# Patient Record
Sex: Female | Born: 1990
Health system: Southern US, Community
[De-identification: ages and names within clinical notes are randomized; demographics above are authoritative.]

## PROBLEM LIST (undated history)

## (undated) DIAGNOSIS — R519 Headache, unspecified: Secondary | ICD-10-CM

## (undated) DIAGNOSIS — N83209 Unspecified ovarian cyst, unspecified side: Secondary | ICD-10-CM

## (undated) DIAGNOSIS — H5461 Unqualified visual loss, right eye, normal vision left eye: Secondary | ICD-10-CM

## (undated) HISTORY — PX: SPINE SURGERY: SHX786

## (undated) HISTORY — DX: Unspecified ovarian cyst, unspecified side: N83.209

## (undated) HISTORY — PX: EYE SURGERY: SHX253

## (undated) HISTORY — DX: Unqualified visual loss, right eye, normal vision left eye: H54.61

## (undated) HISTORY — DX: Headache, unspecified: R51.9

---

## 2010-01-11 ENCOUNTER — Encounter: Payer: Self-pay | Admitting: Maternal & Fetal Medicine

## 2010-01-12 ENCOUNTER — Encounter: Payer: Self-pay | Admitting: Maternal & Fetal Medicine

## 2010-01-25 ENCOUNTER — Encounter: Payer: Self-pay | Admitting: Maternal and Fetal Medicine

## 2010-02-08 ENCOUNTER — Encounter: Payer: Self-pay | Admitting: Obstetrics & Gynecology

## 2010-03-01 ENCOUNTER — Encounter: Payer: Self-pay | Admitting: Obstetrics and Gynecology

## 2010-04-12 ENCOUNTER — Encounter: Payer: Self-pay | Admitting: Obstetrics and Gynecology

## 2010-05-24 ENCOUNTER — Encounter: Payer: Self-pay | Admitting: Maternal & Fetal Medicine

## 2010-06-10 ENCOUNTER — Inpatient Hospital Stay: Payer: Self-pay | Admitting: Obstetrics and Gynecology

## 2020-11-03 ENCOUNTER — Ambulatory Visit (LOCAL_COMMUNITY_HEALTH_CENTER): Payer: Self-pay

## 2020-11-03 ENCOUNTER — Other Ambulatory Visit: Payer: Self-pay

## 2020-11-03 VITALS — BP 108/68 | Ht 64.0 in | Wt 164.5 lb

## 2020-11-03 DIAGNOSIS — Z3201 Encounter for pregnancy test, result positive: Secondary | ICD-10-CM

## 2020-11-03 LAB — PREGNANCY, URINE: Preg Test, Ur: POSITIVE — AB

## 2020-11-03 MED ORDER — PRENATAL 27-0.8 MG PO TABS: 1.0000 | ORAL_TABLET | Freq: Every day | ORAL | 0 refills | Status: AC

## 2020-11-03 NOTE — Progress Notes (Addendum)
UPT positive. Plans prenatal care at [facility]. Per pt, received prenatal care at [facility] for past preg 2011 and 2013. PHQ -6, accepted contact card for A. Mariana Kaufman, LCSW.  To clerk for preadmit. M. [place], interpreter. Jerel Shepherd, RN

## 2020-11-20 ENCOUNTER — Telehealth: Payer: Self-pay

## 2020-11-20 NOTE — Telephone Encounter (Signed)
Returned call to patient who had questions about appointment. Patient states she wants and appointment in June so she can have 2 months of Medicaid, but was told there are no appointments available for scheduling in June yet, and she had an U/S with  no heartbeat seen. She states she was told by ED to follow-up with her doctor's office tomorrow to check for baby's heart beat and then return for another U/S. Patient states the doctor she called was out of town and wanted to know if she could she come here tomorrow. Patient counseled that she hasn't been seen for this pregnancy here yet and she states she had been seen for other pregnancies. Eventually, the patient stated that she is in [place] and had the U/S in [place]. Patient counseled that we'll leave her new OB appointment as is and she can let us know what her U/S shows after she goes tomorrow in [place]. Patient unsure when she plans to return to [facility] and counseled to come in for pre-appointment paperwork and to sign ROI for U/S and notes from [place] providers. Patient states understanding. Interpreter V. Olmedo.Burt Knack, RN

## 2020-11-23 DIAGNOSIS — N390 Urinary tract infection, site not specified: Secondary | ICD-10-CM

## 2020-11-23 HISTORY — DX: Urinary tract infection, site not specified: N39.0

## 2020-11-29 NOTE — Progress Notes (Signed)
OB abstract completed per phone interview done Nov 28, 2020 Henriette Combs RN

## 2020-12-04 ENCOUNTER — Emergency Department
Admission: EM | Admit: 2020-12-04 | Discharge: 2020-12-05 | Disposition: A | Discharge status: Home / Self Care | Payer: Self-pay | Attending: Emergency Medicine | Admitting: Emergency Medicine

## 2020-12-04 ENCOUNTER — Other Ambulatory Visit: Payer: Self-pay

## 2020-12-04 ENCOUNTER — Emergency Department: Payer: Self-pay

## 2020-12-04 ENCOUNTER — Encounter: Payer: Self-pay | Admitting: Emergency Medicine

## 2020-12-04 ENCOUNTER — Telehealth: Payer: Self-pay

## 2020-12-04 DIAGNOSIS — Z87891 Personal history of nicotine dependence: Secondary | ICD-10-CM | POA: Insufficient documentation

## 2020-12-04 DIAGNOSIS — Z3A01 Less than 8 weeks gestation of pregnancy: Secondary | ICD-10-CM | POA: Insufficient documentation

## 2020-12-04 DIAGNOSIS — O209 Hemorrhage in early pregnancy, unspecified: Secondary | ICD-10-CM

## 2020-12-04 DIAGNOSIS — O039 Complete or unspecified spontaneous abortion without complication: Secondary | ICD-10-CM | POA: Insufficient documentation

## 2020-12-04 LAB — BASIC METABOLIC PANEL
Anion gap: 8 (ref 5–15)
BUN: 9 mg/dL (ref 6–20)
CO2: 21 mmol/L — ABNORMAL LOW (ref 22–32)
Calcium: 9 mg/dL (ref 8.9–10.3)
Chloride: 107 mmol/L (ref 98–111)
Creatinine, Ser: 0.41 mg/dL — ABNORMAL LOW (ref 0.44–1.00)
GFR, Estimated: >60 mL/min (ref >60)
Glucose, Bld: 143 mg/dL — ABNORMAL HIGH (ref 70–99)
Potassium: 3.5 mmol/L (ref 3.5–5.1)
Sodium: 136 mmol/L (ref 135–145)

## 2020-12-04 LAB — CBC
HCT: 37.1 % (ref 36.0–46.0)
Hemoglobin: 12.7 g/dL (ref 12.0–15.0)
MCH: 29.6 pg (ref 26.0–34.0)
MCHC: 34.2 g/dL (ref 30.0–36.0)
MCV: 86.5 fL (ref 80.0–100.0)
Platelets: 242 10*3/uL (ref 150–400)
RBC: 4.29 MIL/uL (ref 3.87–5.11)
RDW: 12.3 % (ref 11.5–15.5)
WBC: 9.5 10*3/uL (ref 4.0–10.5)
nRBC: 0 % (ref 0.0–0.2)

## 2020-12-04 LAB — HCG, QUANTITATIVE, PREGNANCY: hCG, Beta Chain, Quant, S: 19812 m[IU]/mL — ABNORMAL HIGH (ref <5)

## 2020-12-04 MED ORDER — ACETAMINOPHEN 500 MG PO TABS
1000.0000 mg | ORAL_TABLET | Freq: Once | ORAL | Status: AC
  Administered 2020-12-05: 1000 mg via ORAL
  Filled 2020-12-04: qty 2

## 2020-12-04 NOTE — Telephone Encounter (Signed)
Call to client to reschedule [facility] IP appt this pm due to provider availability. Call to client with Salli Real and per recordedmessage, number is not in service. Call to emergency contact who states he will try to contact client. Client called [facility] and Prudence Davidson interpreted. [facility] IP appt scheduled for 12/12/2020 with arrival time of 0800. Jossie Ng, RN

## 2020-12-04 NOTE — ED Triage Notes (Signed)
PT to ER with c/o vaginal bleeding.  Pt states she is [redacted] weeks pregnant.  Pt states had prior bleeding that stopped.  States this bleeding started approx 45 mis PTA and is heavy.

## 2020-12-04 NOTE — ED Provider Notes (Signed)
[facility] Emergency Department Provider Note  ____________________________________________   Event Date/Time   First MD Initiated Contact with Patient 12/04/20 2329     (approximate)  I have reviewed the triage vital signs and the nursing notes.   HISTORY  Chief Complaint Vaginal Bleeding    HPI Gabrielle Romero is a 30 y.o. female G4P3 (1 child died 36 hours after birth, born premature) who presents to the emergency department cramping lower abdominal pain, vaginal bleeding that started today.  States that she is seeing dark red blood without clots or tissue.  She denies any fevers, nausea, vomiting, diarrhea, dysuria, vaginal discharge.  States she was seen at a hospital in [place] on May 7 for the same.  Had an ultrasound on May 7, May 10 and May 12.  States that all of these ultrasounds did not show any cardiac activity but was thought due to the pregnancy being early.  She states that her hCGs were rising appropriately.  She has an appointment to see the health department on the 31st.  She denies any history of STDs.  She is not concerned for STDs today and declines testing.  She denies chest pain, shortness of breath, dizziness.  On review of her records from [facility] in [place], patient was diagnosed with a urinary tract infection.  It appears she was prescribed Macrobid on 11/21/20 and then Keflex on 11/23/20.  Her hCG on 11/21/2020 was 39,681.    Spanish interpreter used.    Past Medical History:  Diagnosis Date  . Headache    sometimes due to stress  . Ovarian cyst    states when went for ultrasound was told cyst on right ovary current pregnancy 2022  . Preterm labor    according to patient first pregnancy delivered at 8 month  . UTI (urinary tract infection) 11/23/2020   states had UTI when went to ED and they gave her cephalexin and also pain med (instructed to bring to prenatal appt)  . 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   Past Surgical History:  Procedure Laterality Date  . CESAREAN SECTION     hx 3 c sections  . EYE SURGERY Right ?? 2005 or 2006   due to accident; no vision right eye  . SPINE SURGERY  ???2017   states surgery on spine due to a disc problem    Prior to Admission medications   Medication Sig Start Date End Date Taking? Authorizing Provider  ibuprofen (ADVIL) 800 MG tablet Take 1 tablet (800 mg total) by mouth every 8 (eight) hours as needed for mild pain. 12/05/20  Yes Rajanae Mantia, Baxter Hire N, DO  ondansetron (ZOFRAN ODT) 4 MG disintegrating tablet Take 1 tablet (4 mg total) by mouth every 6 (six) hours as needed for nausea or vomiting. 12/05/20  Yes Libero Puthoff, Layla Maw, DO  Prenatal Vit-Fe Fumarate-FA (MULTIVITAMIN-PRENATAL) 27-0.8 MG TABS tablet Take 1 tablet by mouth daily at 12 noon. 11/03/20 02/11/21  Federico Flake, MD    Allergies Patient has no known allergies.  Family History  Problem Relation Age of Onset  . Diabetes Mother   . Miscarriages / [place] Mother        thinks mother had stillbirth  . Miscarriages / [place] Sister        thinks sister had stillbirth  . Healthy Brother   .  Asthma Daughter        states asthma hx but ok now  . Heart murmur Son        born with heart murmur but states ok now  . Healthy Sister   . Healthy Sister   . Healthy Sister   . Healthy Brother   . Healthy Brother   . Healthy Brother     Social History Social History   Tobacco Use  . Smoking status: Former Smoker    Packs/day: 1.00    Types: Cigarettes    Quit date: 07/2020    Years since quitting: 0.3  . Smokeless tobacco: Never Used  . Tobacco comment: 3 years ago states smoked 1 PPD: quit 07/2020 and was smoking 3-4 cigarettes per day   Vaping Use  . Vaping Use: Former  . Start date: 06/14/2020  . Quit date: 06/14/2020  Substance Use Topics  . Alcohol use:  Not Currently    Comment: last use- 07/2020- glass of wine; during phone interview 11/28/20 states drank whiskey before she knew she was pregnant  . Drug use: Never    Comment: denies    Review of Systems Constitutional: No fever. Eyes: No visual changes. ENT: No sore throat. Cardiovascular: Denies chest pain. Respiratory: Denies shortness of breath. Gastrointestinal: No nausea, vomiting, diarrhea. Genitourinary: Negative for dysuria. Musculoskeletal: Negative for back pain. Skin: Negative for rash. Neurological: Negative for focal weakness or numbness.  ____________________________________________   PHYSICAL EXAM:  VITAL SIGNS: ED Triage Vitals  Enc Vitals Group     BP 12/04/20 1759 128/76     Pulse Rate 12/04/20 1759 77     Resp 12/04/20 1759 18     Temp 12/04/20 1759 98.9 F (37.2 C)     Temp Source 12/04/20 1759 Oral     SpO2 12/04/20 1759 99 %     Weight 12/04/20 1800 164 lb (74.4 kg)     Height 12/04/20 1800 5\' 4"  (1.626 m)     Head Circumference --      Peak Flow --      Pain Score 12/04/20 1800 4     Pain Loc --      Pain Edu? --      Excl. in GC? --    CONSTITUTIONAL: Alert and oriented and responds appropriately to questions. Well-appearing; well-nourished HEAD: Normocephalic EYES: Conjunctivae clear, pupils appear equal, EOM appear intact ENT: normal nose; moist mucous membranes NECK: Supple, normal ROM CARD: RRR; S1 and S2 appreciated; no murmurs, no clicks, no rubs, no gallops RESP: Normal chest excursion without splinting or tachypnea; breath sounds clear and equal bilaterally; no wheezes, no rhonchi, no rales, no hypoxia or respiratory distress, speaking full sentences ABD/GI: Normal bowel sounds; non-distended; soft, non-tender, no rebound, no guarding, no peritoneal signs, no hepatosplenomegaly GU:  Patient defers BACK: The back appears normal EXT: Normal ROM in all joints; no deformity noted, no edema; no cyanosis SKIN: Normal color for age and  race; warm; no rash on exposed skin NEURO: Moves all extremities equally PSYCH: The patient's mood and manner are appropriate.  ____________________________________________   LABS (all labs ordered are listed, but only abnormal results are displayed)  Labs Reviewed  HCG, QUANTITATIVE, PREGNANCY - Abnormal; Notable for the following components:      Result Value   hCG, Beta Chain, Quant, S 12/06/20 (*)    All other components within normal limits  BASIC METABOLIC PANEL - Abnormal; Notable for the following components:   CO2 21 (*)  Glucose, Bld 143 (*)    Creatinine, Ser 0.41 (*)    All other components within normal limits  URINALYSIS, COMPLETE (UACMP) WITH MICROSCOPIC - Abnormal; Notable for the following components:   Color, Urine YELLOW (*)    APPearance CLOUDY (*)    Hgb urine dipstick LARGE (*)    Protein, ur 30 (*)    RBC / HPF >50 (*)    Bacteria, UA MANY (*)    All other components within normal limits  URINE CULTURE  CBC  ABO/RH   ____________________________________________  EKG   ____________________________________________  RADIOLOGY I, Cassandria Drew, personally viewed and evaluated these images (plain radiographs) as part of my medical decision making, as well as reviewing the written report by the radiologist.  ED MD interpretation: IUP without cardiac activity.  Official radiology report(s): US OB LESS THAN 14 WEEKS WITH OB TRANSVAGINAL  Result Date: 12/04/2020 CLINICAL DATA:  Initial evaluation for acute vaginal bleeding, early pregnancy. EXAM: OBSTETRIC <14 WK [place] AND TRANSVAGINAL OB US TECHNIQUE: Both transabdominal and transvaginal ultrasound examinations were performed for complete evaluation of the gestation as well as the maternal uterus, adnexal regions, and pelvic cul-de-sac. Transvaginal technique was performed to assess early pregnancy. COMPARISON:  None. FINDINGS: Intrauterine gestational sac: Single somewhat irregular and elongated gestational  sac. Yolk sac:  Present Embryo:  Present Cardiac Activity: Negative. Heart Rate: N/A CRL: 4.2 mm   6 w   1 d                  [place] EDC: 07/29/2021 Subchorionic hemorrhage:  None visualized. Maternal uterus/adnexae: Ovaries within normal limits bilaterally. Degenerating corpus luteal cyst noted within the right ovary. No adnexal mass or free fluid. IMPRESSION: 1. Single IUP containing an internal yolk sac and embryo, but no detectable cardiac activity, crown-rump length measures 4.2 mm. Findings are suspicious but not yet definitive for failed pregnancy. Recommend follow-up US in 10-14 days for definitive diagnosis. This recommendation follows SRU consensus guidelines: Diagnostic Criteria for Nonviable Pregnancy Early in the First Trimester. Malva Limes Med 2013; 923:3007-62. 2. No other acute maternal uterine or adnexal abnormality. Electronically Signed   By: Rise Mu M.D.   On: 12/04/2020 20:57    ____________________________________________   PROCEDURES  Procedure(s) performed (including Critical Care):  Procedures   ____________________________________________   INITIAL IMPRESSION / ASSESSMENT AND PLAN / ED COURSE  As part of my medical decision making, I reviewed the following data within the electronic MEDICAL RECORD NUMBER Nursing notes reviewed and incorporated, Interpreter needed, Labs reviewed , Old chart reviewed, Radiograph reviewed and Notes from prior ED visits         Patient here with likely missed spontaneous abortion.  hCG on May 10 per outside records was 39,000.  Today it is 19,000.  There is an IUP but no fetal cardiac activity.  She does have an appointment for follow-up with her OB/GYN on the 31st.  Will give Tylenol.  Also appears patient was recently treated for UTI with both Keflex and Macrobid.  We will recheck urine today.  She declines pelvic exam, STD testing.  ED PROGRESS  Patient reports no improvement in pain after Toradol and states now her bleeding has  increased and now some passing small clots.  I did discuss my concerns for miscarriage given her declining hCG.  Patient is very upset.  Will give IM Toradol for symptomatic relief.  Urine shows blood which is likely from her vaginal bleeding.  There are many bacteria  but also many squamous cells.  She is not currently having any urinary symptoms.  She would like to proceed with expectant management given things seem to be progressing on their own.  She does have follow-up scheduled on the 31st.  Will reassess after receiving Toradol to ensure pain has improved.  2:10 AM  Pt reports feeling better with Toradol.  I will discharge with ibuprofen, Zofran.  Have still encouraged her to follow-up with her OB/GYN and discussed bleeding return precautions.  Patient verbalized understanding.  Spanish interpreter used throughout visit.  She remains hemodynamically stable.  At this time, I do not feel there is any life-threatening condition present. I have reviewed, interpreted and discussed all results (EKG, imaging, lab, urine as appropriate) and exam findings with patient/family. I have reviewed nursing notes and appropriate previous records.  I feel the patient is safe to be discharged home without further emergent workup and can continue workup as an outpatient as needed. Discussed usual and customary return precautions. Patient/family verbalize understanding and are comfortable with this plan.  Outpatient follow-up has been provided as needed. All questions have been answered.  ____________________________________________   FINAL CLINICAL IMPRESSION(S) / ED DIAGNOSES  Final diagnoses:  Miscarriage     ED Discharge Orders         Ordered    ibuprofen (ADVIL) 800 MG tablet  Every 8 hours PRN        12/05/20 0205    ondansetron (ZOFRAN ODT) 4 MG disintegrating tablet  Every 6 hours PRN        12/05/20 0205          *Please note:  Bradley FerrisYanet Leota JacobsenRivera Martinez was evaluated in Emergency Department on  12/05/2020 for the symptoms described in the history of present illness. She was evaluated in the context of the global COVID-19 pandemic, which necessitated consideration that the patient might be at risk for infection with the SARS-CoV-2 virus that causes COVID-19. Institutional protocols and algorithms that pertain to the evaluation of patients at risk for COVID-19 are in a state of rapid change based on information released by regulatory bodies including the CDC and federal and state organizations. These policies and algorithms were followed during the patient's care in the ED.  Some ED evaluations and interventions may be delayed as a result of limited staffing during and the pandemic.*   Note:  This document was prepared using Dragon voice recognition software and may include unintentional dictation errors.   Jazlen Ogarro, Layla MawKristen N, DO 12/05/20 0210

## 2020-12-05 LAB — URINALYSIS, COMPLETE (UACMP) WITH MICROSCOPIC
Bilirubin Urine: NEGATIVE
Glucose, UA: NEGATIVE mg/dL
Ketones, ur: NEGATIVE mg/dL
Leukocytes,Ua: NEGATIVE
Nitrite: NEGATIVE
Protein, ur: 30 mg/dL — AB
RBC / HPF: >50 RBC/hpf — ABNORMAL HIGH (ref 0–5)
Specific Gravity, Urine: 1.017 (ref 1.005–1.030)
pH: 5 (ref 5.0–8.0)

## 2020-12-05 LAB — ABO/RH: ABO/RH(D): O POS

## 2020-12-05 MED ORDER — KETOROLAC TROMETHAMINE 30 MG/ML IJ SOLN
60.0000 mg | Freq: Once | INTRAMUSCULAR | Status: AC
  Administered 2020-12-05: 60 mg via INTRAMUSCULAR
  Filled 2020-12-05: qty 2

## 2020-12-05 MED ORDER — IBUPROFEN 800 MG PO TABS: 800.0000 mg | ORAL_TABLET | Freq: Three times a day (TID) | ORAL | 0 refills | Status: DC | PRN

## 2020-12-05 MED ORDER — ONDANSETRON 4 MG PO TBDP: 4.0000 mg | ORAL_TABLET | Freq: Four times a day (QID) | ORAL | 0 refills | Status: DC | PRN

## 2020-12-05 NOTE — Discharge Instructions (Addendum)
You may alternate Tylenol 1000 mg every 6 hours as needed for pain, fever and Ibuprofen 800 mg every 8 hours as needed for pain, fever.  Please take Ibuprofen with food.  Do not take more than 4000 mg of Tylenol (acetaminophen) in a 24 hour period.  Your hCG level today was 19,812.  It was 39,681 on 11/21/20.  These declining hCG values in the setting of no cardiac activity seen on ultrasound with your symptoms of abdominal cramping, vaginal bleeding is suggestive of a miscarriage today.   Puede alternar Tylenol 1000 mg cada 6 horas segn sea necesario para el dolor y la fiebre e ibuprofeno 800 mg cada 8 horas segn sea necesario para el dolor y Insurance account manager. Tome ibuprofeno con alimentos. No tome ms de 4000 mg de Tylenol (acetaminofn) en un perodo de 24 horas.  Tu nivel de hCG hoy fue de 19.812. Eran 39.681 el 04/18/21. Estos valores decrecientes de hCG en el contexto de ausencia de actividad cardaca observada en la ecografa con sus sntomas de calambres abdominales, sangrado vaginal sugieren un aborto espontneo en la actualidad.

## 2020-12-06 ENCOUNTER — Telehealth: Payer: Self-pay

## 2020-12-06 LAB — URINE CULTURE: Culture: <10000 — AB

## 2020-12-06 NOTE — Telephone Encounter (Signed)
Contacted patient to remind her of appointment on 5/31 with [facility] maternity and to arrive at 0900 for appointment.   Patient states she is no longer having pain since ED visit on 5/23. Patient also states her bleeding is very minimal, only sees blood on toilet paper when wiping after urinating but not enough to wear a pad.   Conversation was spoken in spanish, no interpreter needed.  Floy Sabina, RN

## 2020-12-12 ENCOUNTER — Ambulatory Visit: Payer: Self-pay | Admitting: Family Medicine

## 2020-12-12 ENCOUNTER — Other Ambulatory Visit: Payer: Self-pay

## 2020-12-12 VITALS — BP 107/63 | HR 61 | Temp 97.9°F | Wt 165.0 lb

## 2020-12-12 DIAGNOSIS — O200 Threatened abortion: Secondary | ICD-10-CM

## 2020-12-12 NOTE — Progress Notes (Signed)
S: Patient was seen at [facility] ED on 12/04/20- for vaginal bleeding during pregnancy and was told to follow up with her ob provider.  Pt.. LMP was 09/17/20. Patient reports still actively bleeding, but has slowed down  and on 5/28 passing tissue and numerous large clots.     O: on 11/15/20 @ [facility] ED- beta hcg was 19, 812  U/S showed no cardiac activity, products of conception were present.  Previous beta on 5/10 was 39, 681 at hospital in [place].    A/P:1. Miscarriage, threatened, early pregnancy - Beta hCG quant (ref lab)  -discussed with patient findings of ED, the ultra sound and the beta hcg  -discussed with patient  Collection of beta hcg today for follow up of levels.     -discussed with patient about  Checking for products of conception with follow up U/S ~ 14 days after 5/23 U/S.  -pt questions answered.  -discussed taking a PNV daily now.   -will follow up with results and schedule follow up appointment in 1 weeks for B HCG.    - Beta hCG quant (ref lab)  M. Yemen used for [place] interpretation.     Wendi Snipes, FNP

## 2020-12-13 LAB — BETA HCG QUANT (REF LAB): hCG Quant: 1960 m[IU]/mL

## 2020-12-19 ENCOUNTER — Other Ambulatory Visit: Payer: Self-pay

## 2020-12-19 ENCOUNTER — Ambulatory Visit: Payer: Self-pay | Admitting: Family Medicine

## 2020-12-19 VITALS — BP 103/64 | HR 67 | Temp 98.2°F | Wt 166.0 lb

## 2020-12-19 DIAGNOSIS — O200 Threatened abortion: Secondary | ICD-10-CM

## 2020-12-19 NOTE — Progress Notes (Signed)
S: Patient in clinic today for follow up BHCG.  Reports minimal cramping and only seeing blood when wiping.    O: on 12/12/20 - beta hcg was 1,960  5/23 previous U/S showed no cardiac activity, products of conception were present.    A/P:1. Miscarriage, threatened, early pregnancy - Beta hCG quant (ref lab)  -discussed with patient  Collection of beta hcg today for follow up of levels.   -discussed with patient about if today's ab draw is not below 100 then will prescribe Cytotec to help expel all products of conception.   -pt questions answered.  -discussed taking a PNV daily now.   -Will call patient about next steps once lab results are received.  -Emphasized no sex at this time, and for patient to think about it wanting a pregnancy or birth control.   -Patient verbalized understanding     M. [place] used for [place] interpretation.     Wendi Snipes, FNP

## 2020-12-20 LAB — BETA HCG QUANT (REF LAB): hCG Quant: 976 m[IU]/mL

## 2020-12-25 ENCOUNTER — Telehealth: Payer: Self-pay | Admitting: Family Medicine

## 2020-12-25 DIAGNOSIS — O021 Missed abortion: Secondary | ICD-10-CM

## 2020-12-25 MED ORDER — MISOPROSTOL 25 MCG QUARTER TABLET: 800.0000 ug | ORAL_TABLET | Freq: Once | ORAL | Status: DC

## 2020-12-25 NOTE — Telephone Encounter (Signed)
Call to patient to discuss lab results  of BHCG and prescription for cytotec.  Result is  976.  Will prescribe cytotec and send to [facility] on [address].  Directions given to patient on how to take medication and appointment scheduled for follow up beta HCG 1 week after taking the cytotec.    Juliene Pina used for Spanish interpretation.   Wendi Snipes, FNP

## 2020-12-27 ENCOUNTER — Telehealth: Payer: Self-pay | Admitting: Family Medicine

## 2020-12-27 MED ORDER — MISOPROSTOL 200 MCG PO TABS: 800.0000 ug | ORAL_TABLET | Freq: Once | ORAL | 0 refills | Status: DC

## 2020-12-27 NOTE — Telephone Encounter (Signed)
Pt states that she was prescribed a medication and the bottle she picked up from the pharmacy doesn't have instructions on it. Please call.

## 2020-12-27 NOTE — Telephone Encounter (Signed)
Patient has gone twice to the [facility] and they said they have not received the prescription. The patient's notes states that [facility] prescribed it, but I see no orders placed. Place re-sent and confirm to the patient. Thanks.  (Pt. speaks Spanish)

## 2020-12-27 NOTE — Telephone Encounter (Signed)
Called patient to return phone call. Patient was confused because there were 4 tablets and she was expecting 1.   Explained to patient that she needed per originally order but unable to order so each of the 4 tablets are 200 mcg for a total of .   Patient states she remembers that provider, Elveria Rising explained to her to take buccal and also have food in her stomach. Informed patient that she is correct to place Cytotec Buccal and let dissolve and to have some food in he stomach prior to.   All questions answered.   Floy Sabina, RN

## 2020-12-27 NOTE — Telephone Encounter (Signed)
Changed order so patient is able to pick up prescription at her [facility] pharmacy.   Patient was contacted and spoke to her in spanish to let her know prescription was sent to her pharmacy and will be ready for pick up today but advised patient to call pharmacy ahead of time to make sure it is ready.   Floy Sabina, RN

## 2020-12-27 NOTE — Addendum Note (Signed)
Addended by: Floy Sabina on: 12/27/2020 11:53 AM   Modules accepted: Orders

## 2021-01-01 ENCOUNTER — Ambulatory Visit: Payer: Self-pay

## 2021-01-05 ENCOUNTER — Ambulatory Visit: Payer: Self-pay | Admitting: Family Medicine

## 2021-01-05 ENCOUNTER — Ambulatory Visit: Payer: Self-pay

## 2021-01-05 ENCOUNTER — Encounter: Payer: Self-pay | Admitting: Family Medicine

## 2021-01-05 ENCOUNTER — Other Ambulatory Visit: Payer: Self-pay

## 2021-01-05 VITALS — BP 107/64 | HR 77 | Temp 98.2°F | Resp 16 | Ht 64.0 in | Wt 166.0 lb

## 2021-01-05 DIAGNOSIS — O200 Threatened abortion: Secondary | ICD-10-CM

## 2021-01-05 NOTE — Progress Notes (Signed)
S: Patient in clinic to follow up after taking cytotec.    O: Cytotec taken on 12/27/20 - started bleeding on 6/20 with bleeding and passing clots on 6/22.    A/P: 1. Miscarriage, threatened, early pregnancy              B HCG drawn today  Will Order U/S for products of conception after results of BHCG.  Clinic will  call patient with results.   - Beta hCG quant (ref lab)   E. Leticia Clas, RN  used for [employer].      Wendi Snipes, FNP

## 2021-01-05 NOTE — Progress Notes (Signed)
HCG done today. Patient aware we will call her with the ultrasound date. Per provider, Elveria Rising FNP order will be sent after HCG results are in.  Patient agreed with plan of care.   Floy Sabina, RN

## 2021-01-06 LAB — BETA HCG QUANT (REF LAB): hCG Quant: 122 m[IU]/mL

## 2021-01-08 ENCOUNTER — Telehealth: Payer: Self-pay | Admitting: Family Medicine

## 2021-01-08 NOTE — Telephone Encounter (Signed)
Call attempt to patient.  No answer.  Unable to [facility]    Laurin Coder used for Spanish interpretation.

## 2021-01-19 ENCOUNTER — Telehealth: Payer: Self-pay | Admitting: Family Medicine

## 2021-01-19 ENCOUNTER — Encounter: Payer: Self-pay | Admitting: Emergency Medicine

## 2021-01-19 ENCOUNTER — Other Ambulatory Visit: Payer: Self-pay

## 2021-01-19 DIAGNOSIS — N898 Other specified noninflammatory disorders of vagina: Secondary | ICD-10-CM | POA: Insufficient documentation

## 2021-01-19 DIAGNOSIS — N939 Abnormal uterine and vaginal bleeding, unspecified: Secondary | ICD-10-CM | POA: Insufficient documentation

## 2021-01-19 DIAGNOSIS — Z87891 Personal history of nicotine dependence: Secondary | ICD-10-CM | POA: Insufficient documentation

## 2021-01-19 LAB — BASIC METABOLIC PANEL
Anion gap: 7 (ref 5–15)
BUN: 14 mg/dL (ref 6–20)
CO2: 27 mmol/L (ref 22–32)
Calcium: 9.3 mg/dL (ref 8.9–10.3)
Chloride: 103 mmol/L (ref 98–111)
Creatinine, Ser: 0.44 mg/dL (ref 0.44–1.00)
GFR, Estimated: >60 mL/min (ref >60)
Glucose, Bld: 103 mg/dL — ABNORMAL HIGH (ref 70–99)
Potassium: 3.8 mmol/L (ref 3.5–5.1)
Sodium: 137 mmol/L (ref 135–145)

## 2021-01-19 LAB — CBC
HCT: 36.3 % (ref 36.0–46.0)
Hemoglobin: 12.3 g/dL (ref 12.0–15.0)
MCH: 29.6 pg (ref 26.0–34.0)
MCHC: 33.9 g/dL (ref 30.0–36.0)
MCV: 87.3 fL (ref 80.0–100.0)
Platelets: 260 10*3/uL (ref 150–400)
RBC: 4.16 MIL/uL (ref 3.87–5.11)
RDW: 12.6 % (ref 11.5–15.5)
WBC: 9.2 10*3/uL (ref 4.0–10.5)
nRBC: 0 % (ref 0.0–0.2)

## 2021-01-19 LAB — PROTIME-INR
INR: 1 (ref 0.8–1.2)
Prothrombin Time: 13.2 seconds (ref 11.4–15.2)

## 2021-01-19 LAB — HCG, QUANTITATIVE, PREGNANCY: hCG, Beta Chain, Quant, S: 31 m[IU]/mL — ABNORMAL HIGH (ref <5)

## 2021-01-19 LAB — POC URINE PREG, ED: Preg Test, Ur: NEGATIVE

## 2021-01-19 NOTE — Telephone Encounter (Signed)
Client with c/o continued vaginal bleeding (missed AB, Cytotec 12/27/20, 01/05/21 beta   hcg = 122). Client counseled that provider attempted to contact her 01/08/21 with results, but unable to reach her by phone. Client states using 3 - 4 pads per 24 hours and continuing to pass penny to quarter size clots. Reports intermittent low back pain and "feelings of stomach inflammation". Per consult with Elveria Rising FNP-BC, client referred to ED for evaluation. Client counseled regarding recommendation and aware ED has [place] capability and [facility] does not. Client verbalized understanding of above. [employer] ID # [id number] assisted with call. Jossie Ng, RN

## 2021-01-19 NOTE — ED Triage Notes (Signed)
Pt reports back in May she had an spontaneous abortion, reports June the 15th pt reports she took a pill to aid with the termination of pregnancy. Pt reports she has been bleeding since 12/27/2020, went to health department and was encouraged to come to ER for further evaluation. Pt reports she saturates approximately 4 pads per day, pt denies any other symptom. Pt talks in complete sentences no distress noted

## 2021-01-19 NOTE — Telephone Encounter (Signed)
Patient called to ask about her test results and is worried as she is still experiencing bleeding.

## 2021-01-20 ENCOUNTER — Emergency Department: Payer: Self-pay

## 2021-01-20 ENCOUNTER — Emergency Department
Admission: EM | Admit: 2021-01-20 | Discharge: 2021-01-20 | Disposition: A | Discharge status: Home / Self Care | Payer: Self-pay | Attending: Emergency Medicine | Admitting: Emergency Medicine

## 2021-01-20 DIAGNOSIS — O4690 Antepartum hemorrhage, unspecified, unspecified trimester: Secondary | ICD-10-CM

## 2021-01-20 DIAGNOSIS — N939 Abnormal uterine and vaginal bleeding, unspecified: Secondary | ICD-10-CM

## 2021-01-20 LAB — WET PREP, GENITAL
Clue Cells Wet Prep HPF POC: NONE SEEN
Sperm: NONE SEEN
Trich, Wet Prep: NONE SEEN
Yeast Wet Prep HPF POC: NONE SEEN

## 2021-01-20 LAB — CHLAMYDIA/NGC RT PCR (ARMC ONLY)
Chlamydia Tr: NOT DETECTED
N gonorrhoeae: NOT DETECTED

## 2021-01-20 NOTE — ED Provider Notes (Signed)
[facility] Emergency Department Provider Note  ____________________________________________   Event Date/Time   First MD Initiated Contact with Patient 01/20/21 [phone]     (approximate)  I have reviewed the triage vital signs and the nursing notes.   HISTORY  Chief Complaint Vaginal Bleeding    HPI Gabrielle Romero is a 30 y.o. female G4 P3 who presents to the emergency department with continued vaginal bleeding.  States she started to have a spontaneous miscarriage sometime in May.  Was seen in the ED on 12/04/2020 with ultrasound concerning for failed pregnancy.  She thinks she is about 8 to [redacted] weeks pregnant with a last menstrual cycle of 09/17/2020.  States she was seen at the health department and given Cytotec on 12/27/2020.  States she continues to have vaginal bleeding and passing quarter size clots.  States she is soaking about 3-4 pads a day.  States the health department told her to come to the ED for an ultrasound.  States she has not had sexual intercourse since she started bleeding from her miscarriage.  She denies any abdominal pain, fevers, vomiting, diarrhea, abnormal discharge or odor.       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   Past Surgical History:  Procedure Laterality Date   CESAREAN SECTION     hx 3 c sections   EYE SURGERY Right ?? 2005 or 2006   due to accident; no vision right eye   SPINE SURGERY   ???2017   states surgery on spine due to a disc problem    Prior to Admission medications   Medication Sig Start Date End Date Taking? Authorizing Provider  ibuprofen (ADVIL) 800 MG tablet Take 1 tablet (800 mg total) by mouth every 8 (eight) hours as needed for mild pain. Patient not taking: Reported on 12/19/2020 12/05/20   Everlean Bucher, Layla MawKristen N, DO  misoprostol (CYTOTEC) 200 MCG tablet Take 4 tablets (800 mcg total) by mouth once for 1 dose. 12/27/20 12/27/20  Wendi SnipesGarner, Metee[place], FNP  ondansetron (ZOFRAN ODT) 4 MG disintegrating tablet Take 1 tablet (4 mg total) by mouth every 6 (six) hours as needed for nausea or vomiting. Patient not taking: No sig reported 12/05/20   Rupinder Livingston, Layla MawKristen N, DO  Prenatal Vit-Fe Fumarate-FA (MULTIVITAMIN-PRENATAL) 27-0.8 MG TABS tablet Take 1 tablet by mouth daily at 12 noon. 11/03/20 02/11/21  Federico FlakeNewton, Kimberly Niles, MD    Allergies Patient has no known allergies.  Family History  Problem Relation Age of Onset   Diabetes Mother    Miscarriages / [place] Mother        thinks mother had stillbirth   Miscarriages / [place] Sister        thinks sister had stillbirth   Healthy Brother    Asthma Daughter        states asthma hx but ok now   Heart  murmur Son        born with heart murmur but states ok now   Healthy Sister    Healthy Sister    Healthy Sister    Healthy Brother    Healthy Brother    Healthy Brother     Social History Social History   Tobacco Use   Smoking status: Former    Packs/day: 1.00    Pack years: 0.00    Types: Cigarettes    Quit date: 07/2020    Years since quitting: 0.5   Smokeless tobacco: Never   Tobacco comments:    3 years ago states smoked 1 PPD: quit 07/2020 and was smoking 3-4 cigarettes per day   Vaping Use   Vaping Use: Former   Start date: 06/14/2020   Quit date: 06/14/2020  Substance Use Topics   Alcohol use: Not Currently    Comment: last use- 07/2020- glass of wine; during phone interview 11/28/20 states drank  whiskey before she knew she was pregnant   Drug use: Never    Comment: denies    Review of Systems Constitutional: No fever. Eyes: No visual changes. ENT: No sore throat. Cardiovascular: Denies chest pain. Respiratory: Denies shortness of breath. Gastrointestinal: No nausea, vomiting, diarrhea. Genitourinary: Negative for dysuria. Musculoskeletal: Negative for back pain. Skin: Negative for rash. Neurological: Negative for focal weakness or numbness.  ____________________________________________   PHYSICAL EXAM:  VITAL SIGNS: ED Triage Vitals  Enc Vitals Group     BP 01/19/21 2015 113/63     Pulse Rate 01/19/21 2015 76     Resp 01/19/21 2015 16     Temp 01/19/21 2015 98.7 F (37.1 C)     Temp Source 01/19/21 2015 Oral     SpO2 01/19/21 2015 99 %     Weight 01/19/21 2017 165 lb (74.8 kg)     Height 01/19/21 2017 5\' 4"  (1.626 m)     Head Circumference --      Peak Flow --      Pain Score 01/19/21 2016 0     Pain Loc --      Pain Edu? --      Excl. in GC? --    CONSTITUTIONAL: Alert and oriented and responds appropriately to questions. Well-appearing; well-nourished HEAD: Normocephalic EYES: Conjunctivae clear, pupils appear equal, EOM appear intact ENT: normal nose; moist mucous membranes NECK: Supple, normal ROM CARD: RRR; S1 and S2 appreciated; no murmurs, no clicks, no rubs, no gallops RESP: Normal chest excursion without splinting or tachypnea; breath sounds clear and equal bilaterally; no wheezes, no rhonchi, no rales, no hypoxia or respiratory distress, speaking full sentences ABD/GI: Normal bowel sounds; non-distended; soft, non-tender, no rebound, no guarding, no peritoneal signs, no hepatosplenomegaly GU:  Normal external genitalia. No lesions, rashes noted. Patient has small amount of dark red vaginal bleeding on exam coming from the cervical os.  No appreciable vaginal discharge.  No adnexal tenderness, mass or fullness, no cervical motion tenderness. Cervix  is not appear friable.  Cervix is closed.  Chaperone present for exam. BACK: The back appears normal EXT: Normal ROM in all joints; no deformity noted, no edema; no cyanosis SKIN: Normal color for age and race; warm; no rash on exposed skin NEURO: Moves all extremities equally PSYCH: The patient's mood and manner are appropriate.  ____________________________________________   LABS (all labs ordered are listed, but only abnormal results are displayed)  Labs Reviewed  WET PREP, GENITAL - Abnormal; Notable for the following components:  Result Value   WBC, Wet Prep HPF POC RARE (*)    All other components within normal limits  BASIC METABOLIC PANEL - Abnormal; Notable for the following components:   Glucose, Bld 103 (*)    All other components within normal limits  HCG, QUANTITATIVE, PREGNANCY - Abnormal; Notable for the following components:   hCG, Beta Chain, Quant, S 31 (*)    All other components within normal limits  CHLAMYDIA/NGC RT PCR ([facility] ONLY)            CBC  PROTIME-INR  POC URINE PREG, ED   ____________________________________________  EKG   ____________________________________________  RADIOLOGY I, Daouda Lonzo, personally viewed and evaluated these images (plain radiographs) as part of my medical decision making, as well as reviewing the written report by the radiologist.  ED MD interpretation: No acute abnormality.  Official radiology report(s): US OB LESS THAN 14 WEEKS WITH OB TRANSVAGINAL  Result Date: 01/20/2021 CLINICAL DATA:  Vaginal bleeding since 12/27/2020 EXAM: OBSTETRIC <14 WK [place] AND TRANSVAGINAL OB US TECHNIQUE: Both transabdominal and transvaginal ultrasound examinations were performed for complete evaluation of the gestation as well as the maternal uterus, adnexal regions, and pelvic cul-de-sac. Transvaginal technique was performed to assess early pregnancy. COMPARISON:  12/04/2020 FINDINGS: No visible intra or extra uterine gestational sac. No  adnexal mass or ovarian enlargement. The endometrial stripe measures 6 mm. No clear hypervascularity in the endometrial cavity when allowing motion artifact and submucosal signal. Trace fluid in the endometrial canal. No pelvic ascites. IMPRESSION: 1. Pregnancy of unknown location. Differential considerations include intrauterine gestation too early to be sonographically visualized, spontaneous abortion, or ectopic pregnancy. Consider follow-up ultrasound in 10 days and serial quantitative beta HCG follow-up. 2. 6 mm endometrial stripe. Electronically Signed   By: Marnee Spring M.D.   On: 01/20/2021 04:15    ____________________________________________   PROCEDURES  Procedure(s) performed (including Critical Care):  Procedures    ____________________________________________   INITIAL IMPRESSION / ASSESSMENT AND PLAN / ED COURSE  As part of my medical decision making, I reviewed the following data within the electronic MEDICAL RECORD NUMBER Nursing notes reviewed and incorporated, Interpreter needed, Labs reviewed , Old chart reviewed, Radiograph reviewed  Discussed with radiologist, and Notes from prior ED visits         Patient here with continued vaginal bleeding after spontaneous miscarriage requiring Cytotec.  No signs of hemorrhaging.  Patient had one brief episode of tachycardia here but this has resolved.  Otherwise hemodynamically stable.  Hemoglobin 12.3.  hCG is still 31.  This may be downtrending from her recent spontaneous abortion as she states she has not had sexual intercourse since May.  No sign of infection on exam.  Will obtain transvaginal ultrasound to evaluate for retained products of conception.  Differential does include ectopic.  Abdominal exam benign.  Doubt torsion, PID, TOA, appendicitis.  Blood type O positive.  She does not need RhoGAM.  ED PROGRESS  Patient's ultrasound shows no acute abnormality.  No retained products of conception.  Spoke with Dr. Grace Isaac with  radiology to confirm this.  She continues to be hemodynamically stable without any sign of hemorrhage.  I have recommended that she follow-up with the health department or OB/GYN as an outpatient at the beginning of next week to have her hCG rechecked to ensure it is going down to 0.  She states again that she has not had sexual intercourse since May so I doubt that she has a new pregnancy today.  We did discuss bleeding return precautions.  I feel she is safe to be discharged home.  She is comfortable with this plan.  At this time, I do not feel there is any life-threatening condition present. I have reviewed, interpreted and discussed all results (EKG, imaging, lab, urine as appropriate) and exam findings with patient/family. I have reviewed nursing notes and appropriate previous records.  I feel the patient is safe to be discharged home without further emergent workup and can continue workup as an outpatient as needed. Discussed usual and customary return precautions. Patient/family verbalize understanding and are comfortable with this plan.  Outpatient follow-up has been provided as needed. All questions have been answered.  ____________________________________________   FINAL CLINICAL IMPRESSION(S) / ED DIAGNOSES  Final diagnoses:  Vaginal bleeding     ED Discharge Orders     None       *Please note:  Bradley Ferris Sukaina Toothaker was evaluated in Emergency Department on 01/20/2021 for the symptoms described in the history of present illness. She was evaluated in the context of the global COVID-19 pandemic, which necessitated consideration that the patient might be at risk for infection with the SARS-CoV-2 virus that causes COVID-19. Institutional protocols and algorithms that pertain to the evaluation of patients at risk for COVID-19 are in a state of rapid change based on information released by regulatory bodies including the CDC and federal and state organizations. These policies and algorithms  were followed during the patient's care in the ED.  Some ED evaluations and interventions may be delayed as a result of limited staffing during and the pandemic.*   Note:  This document was prepared using Dragon voice recognition software and may include unintentional dictation errors.    Martha Ellerby, Layla Maw, DO 01/20/21 [phone]

## 2021-01-20 NOTE — Discharge Instructions (Addendum)
You were seen in the emergency department for continued vaginal bleeding after your miscarriage.  Your ultrasound showed no retained products of conception.  Your pregnancy blood test called your beta hCG was still elevated at 31.  You need to follow-up with the health department or your primary care doctor or OB/GYN as an outpatient at the beginning of next week to have this rechecked to ensure that this is coming down to 0.  I suspect that your bleeding will improve with time.  If you begin bleeding more heavily and or soaking more than a pad an hour for more than 2 straight hours, have abdominal pain, fever of 100.4 or higher, vomiting that does not stop, feel like you are going to pass out or you do pass out, have chest pain or shortness of breath, please return to the emergency department.   Fue vista en el departamento de emergencias por sangrado vaginal continuo despus de su aborto espontneo. Su ecografa no mostr productos retenidos de Psychologist, sport and exercise. Su anlisis de sangre de [place], llamado beta hCG, todava estaba elevado en 31. Debe hacer un seguimiento con el departamento de salud o su mdico de atencin primaria u obstetra/gineclogo como paciente ambulatorio a principios de la prxima semana para que lo revisen nuevamente y asegurarse de que est bajando a 0. Sospecho que su sangrado mejorar con el [place]. Si comienza a sangrar ms abundantemente o empapa ms de una toalla sanitaria por hora durante ms de 2 horas seguidas, tiene dolor abdominal, fiebre de 100.4 o ms, vmitos que no paran, siente que se va a desmayar o que se va a desmayar fuera, tiene dolor en el pecho o dificultad para respirar, por favor regrese al departamento de emergencias.

## 2021-01-22 ENCOUNTER — Telehealth: Payer: Self-pay

## 2021-01-22 NOTE — Telephone Encounter (Signed)
Client to [facility] ED 01/19/2021 for evaluaton of continued vaginal bleeding with clots. Beta Hcg was 31. Per Dr. Alvester Morin, client needs another provider / hcg appt ~ 01/29/2021. Call to client with [facility] [phone] and left message to call regarding need for appt. Jossie Ng, RN

## 2021-01-23 NOTE — Telephone Encounter (Signed)
Call to client with Gabrielle Romero and appt scheduled for 01/31/2021. As client has been seen multiple times by Elveria Rising FNP-BC, client scheduled in [facility] on her schedule. Jossie Ng, RN

## 2021-01-31 ENCOUNTER — Ambulatory Visit: Payer: Self-pay

## 2021-01-31 ENCOUNTER — Telehealth: Payer: Self-pay

## 2021-01-31 NOTE — Telephone Encounter (Signed)
Contacted patient over missed appointment and to rescheduled to f/u bhcg lab draw.   There was no answer and LVM regarding above informatio in spanish. Call back number given so patient can rescheduled.   Floy Sabina, RN

## 2021-02-01 NOTE — Telephone Encounter (Signed)
Phone call to pt with interpreter Salli Real. Pt agreed to come in for beta hcg lab. Pt scheduled for lab appt on 02/02/21.

## 2021-02-02 ENCOUNTER — Other Ambulatory Visit: Payer: Self-pay

## 2021-02-02 DIAGNOSIS — O200 Threatened abortion: Secondary | ICD-10-CM

## 2021-02-02 NOTE — Progress Notes (Signed)
Pt in [facility] for Beta HCG. M. Yemen, interpreter. RN walked pt to lab. Pt reports vaginal bleeding x 2 months and has questions about next steps. Consult with Elveria Rising, FNP who agrees to see pt to discuss plan and options. RN walked pt to [facility]. Jerel Shepherd, RN

## 2021-02-03 LAB — BETA HCG QUANT (REF LAB): hCG Quant: 9 m[IU]/mL

## 2021-02-05 ENCOUNTER — Telehealth: Payer: Self-pay | Admitting: Family Medicine

## 2021-02-05 NOTE — Telephone Encounter (Signed)
Pt was seen Friday due to miscarriage and was supposed to be scheduled for some type of cleaning, since she continues to bleed. Please call her with an INTERPRETER (Spanish).  Thanks.

## 2021-02-06 NOTE — Telephone Encounter (Signed)
Per Dr. Alvester Morin (see result note on last beta hcg result), needs repeat beta hcg 02/09/2021. Appt scheduled for that day. Counseled regarding a [facility] appt and appt declined at this time.   Call to [facility] to ascertain status of appt based on 02/02/2021 referral for miscarriage evauation for D & C. Per receptionist Selena Batten, referral needs to be sent to [facility] at [phone] for evaluation and they will contact OB Clinic as needed. Referral with snapshot pages faxed with confirmation received. Client counseled that she will receive a call directly from [facility] with appt. Roddie Mc Yemen interpreted during call. Jossie Ng, RN

## 2021-02-07 ENCOUNTER — Telehealth: Payer: Self-pay

## 2021-02-07 NOTE — Telephone Encounter (Signed)
Call from [facility] in [place] with client on the line. Client with recent SAB (refer to prior encounters). Per client after 2 pm today, she began having "lots of blood, clots and cramping". As clots had recently resolved and bleeding has increased, referred now for ED evaluation per E. Sciora CNM. As has [facility] appt pending for miscarriage evaluation for  D & C, encouraged to go there now for evaluation. Haze Rushing interpreted during call today. Jossie Ng, RN

## 2021-02-09 ENCOUNTER — Other Ambulatory Visit: Payer: Self-pay

## 2021-02-12 NOTE — Progress Notes (Signed)
02/09/2021 had D & C at [facility] per Epic. Jossie Ng, RN

## 2021-03-05 NOTE — Progress Notes (Signed)
Consulted by RN re: patient situation.  Reviewed RN note and agree that it reflects our discussion and my recommendations.  ° ° °Kalmen Lollar, FNP  °

## 2021-05-10 ENCOUNTER — Ambulatory Visit: Admit: 2021-05-10 | Payer: Self-pay

## 2021-05-10 ENCOUNTER — Encounter: Payer: Self-pay | Admitting: Emergency Medicine

## 2021-05-10 ENCOUNTER — Ambulatory Visit
Admission: EM | Admit: 2021-05-10 | Discharge: 2021-05-10 | Disposition: A | Discharge status: Home / Self Care | Payer: Self-pay | Attending: Emergency Medicine | Admitting: Emergency Medicine

## 2021-05-10 ENCOUNTER — Other Ambulatory Visit: Payer: Self-pay

## 2021-05-10 DIAGNOSIS — J029 Acute pharyngitis, unspecified: Secondary | ICD-10-CM

## 2021-05-10 DIAGNOSIS — B349 Viral infection, unspecified: Secondary | ICD-10-CM

## 2021-05-10 MED ORDER — AMOXICILLIN 500 MG PO TABS: 500.0000 mg | ORAL_TABLET | Freq: Three times a day (TID) | ORAL | 0 refills | Status: DC

## 2021-05-10 NOTE — Discharge Instructions (Addendum)
Your COVID and flu tests are pending.  Take the prescribed amoxicillin as directed.  Take Tylenol or ibuprofen as needed for fever or discomfort.  Follow up with your primary care provider if your symptoms are not improving.

## 2021-05-10 NOTE — ED Triage Notes (Signed)
Interpreter services was used for the entirety of this visit. Pt here with sore throat and right ear pain x 3 days. Pt had some leftover amoxicillin and took 3 days worth. Throat is painful and red with exudate.

## 2021-05-10 NOTE — ED Provider Notes (Signed)
Gabrielle Romero    CSN: [id number] Arrival date & time: 05/10/21  1307      History   Chief Complaint Chief Complaint  Patient presents with   Sore Throat   Otalgia     HPI Gabrielle Romero is a 30 y.o. female.  Patient presents with 4-day history of sore throat.  She also reports ear pain and 1 episode of diarrhea yesterday.  She denies fever, rash, cough, shortness of breath, vomiting, or other symptoms.  Treatment at home with ibuprofen and 3 days of amoxicillin that was not prescribed for her.  The history is provided by the patient. A language interpreter was used.   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   Past Surgical History:  Procedure Laterality Date   CESAREAN SECTION     hx 3 c sections   EYE SURGERY Right ?? 2005 or 2006   due to accident; no vision right eye   SPINE SURGERY  ???2017   states surgery on spine due to a disc problem    OB History     Gravida  4   Para  3   Term  2   Preterm  1   AB  1   Living  2      SAB  1   IAB  0   Ectopic  0   Multiple  0   Live Births  3        Obstetric Comments  Pregnancy #1 States water broke and baby "was in there for some time" and had water in lungs. Lived 32 hours. Pt states she was approximately 8 months along.  Pregnancy #2 States csection was planned for Dec 12 but water broke earlier.  Also states she  was given an injection every month before delivery due to a problem with her  mucus plug.  Pregnancy #3 States baby had "water in lungs" and was in intensive care unit 15 days          Home Medications    Prior to Admission medications   Medication Sig Start Date End Date Taking? Authorizing Provider  amoxicillin (AMOXIL) 500 MG tablet Take 1 tablet (500 mg total) by mouth 3 (three) times daily. 05/10/21   Mickie Bail, NP  ibuprofen (ADVIL) 800 MG tablet Take 1 tablet (800 mg total) by mouth every 8 (eight) hours as needed for mild pain. Patient not taking: Reported on 12/19/2020 12/05/20   Ward, Layla Maw, DO  misoprostol (CYTOTEC) 200 MCG tablet Take 4 tablets (800 mcg total) by mouth once for 1 dose. 12/27/20 12/27/20  Wendi Snipes, FNP  ondansetron (ZOFRAN ODT) 4 MG disintegrating tablet Take 1 tablet (4 mg total) by mouth every 6 (six) hours as needed for nausea or vomiting. Patient not taking: No sig reported 12/05/20   Ward, Layla Maw, DO    Family History  Family History  Problem Relation Age of Onset   Diabetes Mother    Miscarriages / [place] Mother        thinks mother had stillbirth   Miscarriages / [place] Sister        thinks sister had stillbirth   Healthy Brother    Asthma Daughter        states asthma hx but ok now   Heart murmur Son        born with heart murmur but states ok now   Healthy Sister    Healthy Sister    Healthy Sister    Healthy Brother    Healthy Brother    Healthy Brother     Social History Social History   Tobacco Use   Smoking status: Former    Packs/day: 1.00    Types: Cigarettes    Quit date: 07/2020    Years since quitting: 0.8   Smokeless tobacco: Never   Tobacco comments:    3 years ago states smoked 1 PPD: quit 07/2020 and was smoking 3-4 cigarettes per day   Vaping Use   Vaping Use: Former   Start date: 06/14/2020   Quit date: 06/14/2020  Substance Use Topics   Alcohol use: Not Currently    Comment: last use- 07/2020- glass of wine; during phone interview 11/28/20 states drank whiskey before  she knew she was pregnant   Drug use: Never    Comment: denies     Allergies   Patient has no known allergies.   Review of Systems Review of Systems  Constitutional:  Negative for chills and fever.  HENT:  Positive for ear pain and sore throat.   Respiratory:  Negative for cough and shortness of breath.   Cardiovascular:  Negative for chest pain and palpitations.  Gastrointestinal:  Positive for diarrhea. Negative for abdominal pain and vomiting.  Skin:  Negative for color change and rash.  All other systems reviewed and are negative.   Physical Exam Triage Vital Signs ED Triage Vitals  Enc Vitals Group     BP      Pulse      Resp      Temp      Temp src      SpO2      Weight      Height      Head Circumference      Peak Flow      Pain Score      Pain Loc      Pain Edu?      Excl. in GC?    No data found.  Updated Vital Signs BP 111/68   Pulse 74   Temp 98.8 F (37.1 C) (Oral)   Resp 20   LMP 04/16/2021 (Approximate)   SpO2 98%   Breastfeeding No   Visual Acuity Right Eye Distance:   Left Eye Distance:   Bilateral Distance:    Right Eye Near:   Left Eye Near:    Bilateral Near:     Physical Exam Vitals and nursing note reviewed.  Constitutional:      General: She is not in acute distress.    Appearance: She is well-developed. She is not ill-appearing.  HENT:     Head: Normocephalic and atraumatic.     Right Ear: Tympanic membrane normal.     Left Ear: Tympanic membrane normal.     Nose: Nose normal.     Mouth/Throat:     Mouth: Mucous membranes are moist.  Pharynx: Oropharyngeal exudate and posterior oropharyngeal erythema present.     Tonsils: 2+ on the right. 2+ on the left.  Eyes:     Conjunctiva/sclera: Conjunctivae normal.  Cardiovascular:     Rate and Rhythm: Normal rate and regular rhythm.     Heart sounds: Normal heart sounds.  Pulmonary:     Effort: Pulmonary effort is normal. No respiratory distress.     Breath sounds:  Normal breath sounds.  Abdominal:     Palpations: Abdomen is soft.     Tenderness: There is no abdominal tenderness.  Musculoskeletal:     Cervical back: Neck supple.  Skin:    General: Skin is warm and dry.  Neurological:     General: No focal deficit present.     Mental Status: She is alert and oriented to person, place, and time.     Gait: Gait normal.  Psychiatric:        Mood and Affect: Mood normal.        Behavior: Behavior normal.     UC Treatments / Results  Labs (all labs ordered are listed, but only abnormal results are displayed) Labs Reviewed - No data to display  EKG   Radiology No results found.  Procedures Procedures (including critical care time)  Medications Ordered in UC Medications - No data to display  Initial Impression / Assessment and Plan / UC Course  I have reviewed the triage vital signs and the nursing notes.  Pertinent labs & imaging results that were available during my care of the patient were reviewed by me and considered in my medical decision making (see chart for details).   Sore throat, Viral illness.  No strep test today because patient has been taking amoxicillin for 3 days; this medication was not prescribed but was left over from a previous illness.  However, based on the appearance of the patient's throat, treating with an additional 7 days of amoxicillin to cover for strep.  Discussed with patient that she should not take leftover antibiotics.  Instructed her to take Tylenol or ibuprofen as needed for fever or discomfort.  COVID and flu pending.  Instructed patient to self quarantine per CDC guidelines.  Instructed her to follow-up with her PCP if her symptoms are not improving.  She agrees to plan of care.   Final Clinical Impressions(s) / UC Diagnoses   Final diagnoses:  Sore throat  Viral illness     Discharge Instructions      Your COVID and flu tests are pending.  Take the prescribed amoxicillin as directed.  Take  Tylenol or ibuprofen as needed for fever or discomfort.  Follow up with your primary care provider if your symptoms are not improving.        ED Prescriptions     Medication Sig Dispense Auth. Provider   amoxicillin (AMOXIL) 500 MG tablet  (Status: Discontinued) Take 1 tablet (500 mg total) by mouth 3 (three) times daily. 21 tablet Mickie Bail, NP   amoxicillin (AMOXIL) 500 MG tablet Take 1 tablet (500 mg total) by mouth 3 (three) times daily. 21 tablet Mickie Bail, NP      PDMP not reviewed this encounter.   Mickie Bail, NP 05/10/21 1356

## 2021-05-11 LAB — COVID-19, FLU A+B NAA
Influenza A, NAA: NOT DETECTED
Influenza B, NAA: NOT DETECTED
SARS-CoV-2, NAA: NOT DETECTED

## 2021-06-25 ENCOUNTER — Encounter: Payer: Self-pay | Admitting: Emergency Medicine

## 2021-06-25 ENCOUNTER — Inpatient Hospital Stay
Admission: EM | Admit: 2021-06-25 | Discharge: 2021-06-27 | DRG: 831 | Disposition: A | Discharge status: Home / Self Care | Payer: Self-pay | Attending: Family Medicine | Admitting: Family Medicine

## 2021-06-25 ENCOUNTER — Emergency Department: Payer: Self-pay

## 2021-06-25 ENCOUNTER — Other Ambulatory Visit: Payer: Self-pay

## 2021-06-25 ENCOUNTER — Emergency Department
Admission: EM | Admit: 2021-06-25 | Discharge: 2021-06-25 | Disposition: A | Discharge status: Home / Self Care | Payer: Medicaid Other | Attending: Emergency Medicine | Admitting: Emergency Medicine

## 2021-06-25 ENCOUNTER — Emergency Department: Payer: Medicaid Other

## 2021-06-25 DIAGNOSIS — R109 Unspecified abdominal pain: Secondary | ICD-10-CM | POA: Diagnosis present

## 2021-06-25 DIAGNOSIS — R7401 Elevation of levels of liver transaminase levels: Secondary | ICD-10-CM | POA: Diagnosis present

## 2021-06-25 DIAGNOSIS — Z87891 Personal history of nicotine dependence: Secondary | ICD-10-CM | POA: Diagnosis not present

## 2021-06-25 DIAGNOSIS — Z825 Family history of asthma and other chronic lower respiratory diseases: Secondary | ICD-10-CM

## 2021-06-25 DIAGNOSIS — O209 Hemorrhage in early pregnancy, unspecified: Secondary | ICD-10-CM | POA: Diagnosis present

## 2021-06-25 DIAGNOSIS — Z20822 Contact with and (suspected) exposure to covid-19: Secondary | ICD-10-CM | POA: Diagnosis present

## 2021-06-25 DIAGNOSIS — N9489 Other specified conditions associated with female genital organs and menstrual cycle: Secondary | ICD-10-CM | POA: Diagnosis not present

## 2021-06-25 DIAGNOSIS — O99611 Diseases of the digestive system complicating pregnancy, first trimester: Principal | ICD-10-CM | POA: Diagnosis present

## 2021-06-25 DIAGNOSIS — Z3490 Encounter for supervision of normal pregnancy, unspecified, unspecified trimester: Secondary | ICD-10-CM

## 2021-06-25 DIAGNOSIS — K8020 Calculus of gallbladder without cholecystitis without obstruction: Secondary | ICD-10-CM

## 2021-06-25 DIAGNOSIS — R7989 Other specified abnormal findings of blood chemistry: Secondary | ICD-10-CM

## 2021-06-25 DIAGNOSIS — Z3A01 Less than 8 weeks gestation of pregnancy: Secondary | ICD-10-CM | POA: Insufficient documentation

## 2021-06-25 DIAGNOSIS — O26611 Liver and biliary tract disorders in pregnancy, first trimester: Secondary | ICD-10-CM | POA: Diagnosis present

## 2021-06-25 DIAGNOSIS — R9389 Abnormal findings on diagnostic imaging of other specified body structures: Secondary | ICD-10-CM

## 2021-06-25 DIAGNOSIS — K7200 Acute and subacute hepatic failure without coma: Secondary | ICD-10-CM | POA: Diagnosis present

## 2021-06-25 DIAGNOSIS — K8050 Calculus of bile duct without cholangitis or cholecystitis without obstruction: Secondary | ICD-10-CM

## 2021-06-25 DIAGNOSIS — Z833 Family history of diabetes mellitus: Secondary | ICD-10-CM

## 2021-06-25 DIAGNOSIS — K8070 Calculus of gallbladder and bile duct without cholecystitis without obstruction: Secondary | ICD-10-CM | POA: Diagnosis present

## 2021-06-25 DIAGNOSIS — R1011 Right upper quadrant pain: Secondary | ICD-10-CM | POA: Diagnosis present

## 2021-06-25 DIAGNOSIS — Z3491 Encounter for supervision of normal pregnancy, unspecified, first trimester: Secondary | ICD-10-CM

## 2021-06-25 LAB — HEPATIC FUNCTION PANEL
ALT: 241 U/L — ABNORMAL HIGH (ref 0–44)
AST: 489 U/L — ABNORMAL HIGH (ref 15–41)
Albumin: 4.3 g/dL (ref 3.5–5.0)
Alkaline Phosphatase: 92 U/L (ref 38–126)
Bilirubin, Direct: 0.3 mg/dL — ABNORMAL HIGH (ref 0.0–0.2)
Indirect Bilirubin: 0.4 mg/dL (ref 0.3–0.9)
Total Bilirubin: 0.7 mg/dL (ref 0.3–1.2)
Total Protein: 7.8 g/dL (ref 6.5–8.1)

## 2021-06-25 LAB — HCG, QUANTITATIVE, PREGNANCY: hCG, Beta Chain, Quant, S: 6523 m[IU]/mL — ABNORMAL HIGH (ref <5)

## 2021-06-25 LAB — CBC WITH DIFFERENTIAL/PLATELET
Abs Immature Granulocytes: 0.07 10*3/uL (ref 0.00–0.07)
Basophils Absolute: 0 10*3/uL (ref 0.0–0.1)
Basophils Relative: 0 %
Eosinophils Absolute: 0.1 10*3/uL (ref 0.0–0.5)
Eosinophils Relative: 1 %
HCT: 39.5 % (ref 36.0–46.0)
Hemoglobin: 13 g/dL (ref 12.0–15.0)
Immature Granulocytes: 1 %
Lymphocytes Relative: 19 %
Lymphs Abs: 2.1 10*3/uL (ref 0.7–4.0)
MCH: 28.3 pg (ref 26.0–34.0)
MCHC: 32.9 g/dL (ref 30.0–36.0)
MCV: 86.1 fL (ref 80.0–100.0)
Monocytes Absolute: 1 10*3/uL (ref 0.1–1.0)
Monocytes Relative: 9 %
Neutro Abs: 7.7 10*3/uL (ref 1.7–7.7)
Neutrophils Relative %: 70 %
Platelets: 239 10*3/uL (ref 150–400)
RBC: 4.59 MIL/uL (ref 3.87–5.11)
RDW: 13.2 % (ref 11.5–15.5)
WBC: 10.9 10*3/uL — ABNORMAL HIGH (ref 4.0–10.5)
nRBC: 0 % (ref 0.0–0.2)

## 2021-06-25 LAB — BASIC METABOLIC PANEL
Anion gap: 7 (ref 5–15)
Anion gap: 6 (ref 5–15)
BUN: 13 mg/dL (ref 6–20)
BUN: 13 mg/dL (ref 6–20)
CO2: 21 mmol/L — ABNORMAL LOW (ref 22–32)
CO2: 25 mmol/L (ref 22–32)
Calcium: 9 mg/dL (ref 8.9–10.3)
Calcium: 9.2 mg/dL (ref 8.9–10.3)
Chloride: 107 mmol/L (ref 98–111)
Chloride: 103 mmol/L (ref 98–111)
Creatinine, Ser: 0.39 mg/dL — ABNORMAL LOW (ref 0.44–1.00)
Creatinine, Ser: 0.47 mg/dL (ref 0.44–1.00)
GFR, Estimated: >60 mL/min (ref >60)
GFR, Estimated: >60 mL/min (ref >60)
Glucose, Bld: 119 mg/dL — ABNORMAL HIGH (ref 70–99)
Glucose, Bld: 118 mg/dL — ABNORMAL HIGH (ref 70–99)
Potassium: 4 mmol/L (ref 3.5–5.1)
Potassium: 4.2 mmol/L (ref 3.5–5.1)
Sodium: 135 mmol/L (ref 135–145)
Sodium: 134 mmol/L — ABNORMAL LOW (ref 135–145)

## 2021-06-25 LAB — URINALYSIS, COMPLETE (UACMP) WITH MICROSCOPIC
Bacteria, UA: NONE SEEN
Bilirubin Urine: NEGATIVE
Glucose, UA: NEGATIVE mg/dL
Ketones, ur: NEGATIVE mg/dL
Leukocytes,Ua: NEGATIVE
Nitrite: NEGATIVE
Protein, ur: NEGATIVE mg/dL
Specific Gravity, Urine: 1.02 (ref 1.005–1.030)
pH: 5 (ref 5.0–8.0)

## 2021-06-25 LAB — LIPASE, BLOOD: Lipase: 33 U/L (ref 11–51)

## 2021-06-25 LAB — CBC
HCT: 39.9 % (ref 36.0–46.0)
Hemoglobin: 13.3 g/dL (ref 12.0–15.0)
MCH: 28.7 pg (ref 26.0–34.0)
MCHC: 33.3 g/dL (ref 30.0–36.0)
MCV: 86.2 fL (ref 80.0–100.0)
Platelets: 255 10*3/uL (ref 150–400)
RBC: 4.63 MIL/uL (ref 3.87–5.11)
RDW: 13.1 % (ref 11.5–15.5)
WBC: 9.1 10*3/uL (ref 4.0–10.5)
nRBC: 0 % (ref 0.0–0.2)

## 2021-06-25 LAB — IRON AND TIBC
Iron: 62 ug/dL (ref 28–170)
Saturation Ratios: 17 % (ref 10.4–31.8)
TIBC: 364 ug/dL (ref 250–450)
UIBC: 302 ug/dL

## 2021-06-25 LAB — ABO/RH: ABO/RH(D): O POS

## 2021-06-25 LAB — SALICYLATE LEVEL: Salicylate Lvl: <7 mg/dL — ABNORMAL LOW (ref 7.0–30.0)

## 2021-06-25 LAB — POC URINE PREG, ED: Preg Test, Ur: POSITIVE — AB

## 2021-06-25 LAB — ACETAMINOPHEN LEVEL: Acetaminophen (Tylenol), Serum: 12 ug/mL (ref 10–30)

## 2021-06-25 LAB — PROTIME-INR
INR: 1 (ref 0.8–1.2)
Prothrombin Time: 13.6 seconds (ref 11.4–15.2)

## 2021-06-25 LAB — RESP PANEL BY RT-PCR (FLU A&B, COVID) ARPGX2
Influenza A by PCR: NEGATIVE
Influenza B by PCR: NEGATIVE
SARS Coronavirus 2 by RT PCR: NEGATIVE

## 2021-06-25 LAB — FERRITIN: Ferritin: 189 ng/mL (ref 11–307)

## 2021-06-25 MED ORDER — OXYCODONE-ACETAMINOPHEN 7.5-325 MG PO TABS
1.0000 | ORAL_TABLET | Freq: Three times a day (TID) | ORAL | Status: DC | PRN
  Administered 2021-06-25: 23:00:00 1 via ORAL
  Filled 2021-06-25: qty 1

## 2021-06-25 MED ORDER — ACETAMINOPHEN 325 MG PO TABS
650.0000 mg | ORAL_TABLET | Freq: Four times a day (QID) | ORAL | Status: DC | PRN
  Administered 2021-06-25: 650 mg via ORAL
  Filled 2021-06-25: qty 2

## 2021-06-25 MED ORDER — ACETAMINOPHEN 500 MG PO TABS
1000.0000 mg | ORAL_TABLET | Freq: Once | ORAL | Status: AC
  Administered 2021-06-25: 1000 mg via ORAL
  Filled 2021-06-25: qty 2

## 2021-06-25 MED ORDER — DOCUSATE SODIUM 100 MG PO CAPS
100.0000 mg | ORAL_CAPSULE | Freq: Two times a day (BID) | ORAL | Status: AC
  Administered 2021-06-25: 100 mg via ORAL
  Filled 2021-06-25: qty 1

## 2021-06-25 MED ORDER — METOCLOPRAMIDE HCL 5 MG/ML IJ SOLN: 10.0000 mg | Freq: Three times a day (TID) | INTRAMUSCULAR | Status: DC | PRN

## 2021-06-25 MED ORDER — SODIUM CHLORIDE 0.9 % IV SOLN: INTRAVENOUS | Status: DC

## 2021-06-25 MED ORDER — ONDANSETRON 8 MG PO TBDP
8.0000 mg | ORAL_TABLET | Freq: Once | ORAL | Status: AC
  Administered 2021-06-25: 8 mg via ORAL
  Filled 2021-06-25: qty 1

## 2021-06-25 MED ORDER — PANTOPRAZOLE SODIUM 40 MG IV SOLR
40.0000 mg | Freq: Every day | INTRAVENOUS | Status: DC
  Administered 2021-06-25 – 2021-06-27 (×3): 40 mg via INTRAVENOUS
  Filled 2021-06-25 (×4): qty 40

## 2021-06-25 NOTE — ED Triage Notes (Signed)
Pt via POV from home. Pt c/o lower abd pain and vaginal bleeding since yesterday, pt states blood is dark. Pt's pregnancy has not been confirmed by a doctor. Pt thinks she is approx [redacted] weeks pregnant. LMP 10/29 Pt is A&Ox4 and NAD  Pt needs spanish interpreter

## 2021-06-25 NOTE — ED Notes (Signed)
Pt presents to the ED for vaginal bleeding and lower abd pain. Pt states the vaginal bleeding started last night and is dark in nature. States that the abd pain started this morning. Pt states she is approx [redacted] weeks pregnant but it has not be confirmed by a doctor. LMP was 10/26. Pt has a hx of miscarriages in the past. Denies any NV. Denies fever. Denies urinary symptoms. Pt is A&Ox4 and NAD.

## 2021-06-25 NOTE — ED Notes (Signed)
ED Provider at bedside. Assessment with use of video interpreter.

## 2021-06-25 NOTE — ED Provider Notes (Signed)
[facility] Emergency Department Provider Note  ____________________________________________   Event Date/Time   First MD Initiated Contact with Patient 06/25/21 [id number]     (approximate)  I have reviewed the triage vital signs and the nursing notes.   HISTORY  Chief Complaint Vaginal Bleeding and Abdominal Pain   HPI Gabrielle Romero is a 30 y.o. female presents to the ED today with complaint of vaginal spotting that started this morning.  Patient states the blood is dark.  She has not had 1 full pad since the beginning of the spotting.  Patient reports some cramping sensation.  She approximates her pregnancy at 6 weeks.  She denies any other symptoms.  Her plans are to follow-up with [facility] department for prenatal care.         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   Past Surgical History:  Procedure Laterality Date   CESAREAN SECTION     hx 3 c sections   EYE SURGERY Right ?? 2005 or 2006   due to accident; no vision right eye   SPINE SURGERY  ???2017   states surgery on spine due to a disc problem    Prior to Admission medications   Medication Sig Start Date End Date Taking? Authorizing Provider  misoprostol (CYTOTEC) 200 MCG tablet Take 4 tablets (800 mcg total) by mouth once for 1 dose. 12/27/20 12/27/20  Wendi Snipes, FNP    Allergies Patient has no known allergies.  Family History  Problem Relation Age of Onset    Diabetes Mother    Miscarriages / [place] Mother        thinks mother had stillbirth   Miscarriages / [place] Sister        thinks sister had stillbirth   Healthy Brother    Asthma Daughter        states asthma hx but ok now   Heart murmur Son        born with heart murmur but states ok now   Healthy Sister    Healthy Sister    Healthy Sister    Healthy Brother    Healthy Brother    Healthy Brother     Social History Social History   Tobacco Use   Smoking status: Former    Packs/day: 1.00    Types: Cigarettes    Quit date: 07/2020    Years since quitting: 0.9   Smokeless tobacco: Never   Tobacco comments:    3 years ago states smoked 1 PPD: quit 07/2020 and was smoking 3-4 cigarettes per day   Vaping Use   Vaping Use: Former   Start date: 06/14/2020   Quit date: 06/14/2020  Substance Use Topics   Alcohol use: Not Currently    Comment: last use- 07/2020- glass of wine; during phone interview 11/28/20 states drank whiskey before she knew she was pregnant  Drug use: Never    Comment: denies    Review of Systems Constitutional: No fever/chills Eyes: No visual changes. ENT: No sore throat. Cardiovascular: Denies chest pain. Respiratory: Denies shortness of breath. Gastrointestinal: No abdominal pain.  No nausea, no vomiting.  No diarrhea.   Genitourinary: Negative for dysuria.  Positive vaginal spotting. Musculoskeletal: Negative for musculoskeletal pain. Skin: Negative for rash. Neurological: Negative for headaches, focal weakness or numbness. ____________________________________________   PHYSICAL EXAM:  VITAL SIGNS: ED Triage Vitals [06/25/21 0557]  Enc Vitals Group     BP 131/74     Pulse Rate 92     Resp 20     Temp 98.7 F (37.1 C)     Temp Source Oral     SpO2 97 %     Weight 164 lb (74.4 kg)     Height 5\' 4"  (1.626 m)     Head Circumference      Peak Flow      Pain Score 7     Pain Loc      Pain Edu?      Excl. in GC?     Constitutional: Alert and oriented. Well appearing and in no acute distress. Eyes: Conjunctivae are normal.  Head: Atraumatic. Neck: No stridor.   Cardiovascular: Normal rate, regular rhythm. Grossly normal heart sounds.  Good peripheral circulation. Respiratory: Normal respiratory effort.  No retractions. Lungs CTAB. Gastrointestinal: Soft and nontender. No distention.  Bowel sounds normoactive x4 quadrants. Musculoskeletal: Moves upper and lower extremities with any difficulty.  Normal gait was noted.   Neurologic:  Normal speech and language. No gross focal neurologic deficits are appreciated. No gait instability. Skin:  Skin is warm, dry and intact. No rash noted. Psychiatric: Mood and affect are normal. Speech and behavior are normal.  ____________________________________________   LABS (all labs ordered are listed, but only abnormal results are displayed)  Labs Reviewed  URINALYSIS, COMPLETE (UACMP) WITH MICROSCOPIC - Abnormal; Notable for the following components:      Result Value   Color, Urine YELLOW (*)    APPearance HAZY (*)    Hgb urine dipstick LARGE (*)    All other components within normal limits  BASIC METABOLIC PANEL - Abnormal; Notable for the following components:   CO2 21 (*)    Glucose, Bld 119 (*)    Creatinine, Ser 0.39 (*)    All other components within normal limits  HCG, QUANTITATIVE, PREGNANCY - Abnormal; Notable for the following components:   hCG, Beta Chain, Quant, S 6,523 (*)    All other components within normal limits  POC URINE PREG, ED - Abnormal; Notable for the following components:   Preg Test, Ur POSITIVE (*)    All other components within normal limits  CBC  ABO/RH   ____________________________________________  RADIOLOGY 03-20-1989, personally viewed and evaluated these images (plain radiographs) as part of my medical decision making, as well as reviewing the written report by the radiologist.    Official radiology  report(s): Beaulah Corin OB LESS THAN 14 WEEKS WITH OB TRANSVAGINAL  Result Date: 06/25/2021 CLINICAL DATA:  Vaginal bleeding EXAM: OBSTETRIC <14 WK 14/06/2021 AND TRANSVAGINAL OB US TECHNIQUE: Both transabdominal and transvaginal ultrasound examinations were performed for complete evaluation of the gestation as well as the maternal uterus, adnexal regions, and pelvic cul-de-sac. Transvaginal technique was performed to assess early pregnancy. COMPARISON:  None. FINDINGS: Intrauterine gestational sac: Single Yolk sac:  Visualized. Embryo:  Not Visualized. MSD: 5.5 mm   5 w  2 d Subchorionic hemorrhage:  None visualized. Maternal uterus/adnexae: Unremarkable.  Trace free fluid. IMPRESSION: Intrauterine pregnancy with gestational sac and yolk sac identified. No embryo visualized at this time. Follow-up ultrasound recommended in 10 days. Trace free fluid. Electronically Signed   By: Guadlupe Spanish M.D.   On: 06/25/2021 07:27    ____________________________________________   PROCEDURES  Procedure(s) performed (including Critical Care):  Procedures   ____________________________________________   INITIAL IMPRESSION / ASSESSMENT AND PLAN / ED COURSE  As part of my medical decision making, I reviewed the following data within the electronic MEDICAL RECORD NUMBER Notes from prior ED visits and [place] Controlled Substance Database  30 year old female presents to the ED with complaint of vaginal spotting that began earlier this morning.  Patient states that her pregnancy has not been confirmed by a doctor but she believes that she is [redacted] weeks pregnant.  Beta hCG was 6523 and patient is O+.  Ultrasound less than 14 weeks shows single IUP at 5 weeks and 2 days.  Patient was reassured.  She currently has plans to follow-up at the [facility] department for her prenatal care.  She was made aware that if there are any urgent concerns she may return to the emergency department prior to her  appointment.  ____________________________________________   FINAL CLINICAL IMPRESSION(S) / ED DIAGNOSES  Final diagnoses:  Vaginal bleeding affecting early pregnancy  First trimester pregnancy     ED Discharge Orders     None        Note:  This document was prepared using Dragon voice recognition software and may include unintentional dictation errors.    Tommi Rumps, PA-C 06/25/21 1109    Delton [place], MD 06/25/21 [phone]

## 2021-06-25 NOTE — H&P (Signed)
History and Physical    Gabrielle Romero LSL:[id number] DOB: 02-28-1991 DOA: 06/25/2021  PCP: Department, [facility]   Patient coming from: Home  I have personally briefly reviewed patient's old medical records in [place]  Chief Complaint: Abdominal pain  Most of the history was obtained using the video interpreter.  Patient does not speak [place].  HPI: Gabrielle Romero is a 30 y.o. female with medical history significant for intrauterine pregnancy (5 weeks) who presents to the emergency room for the second time within 6 hours for evaluation of severe right upper quadrant pain which she rated a 10 x 10 in intensity at its worst.  Pain is nonradiating and is associated with nausea and multiple episodes of emesis. Patient states that she has had intermittent abdominal pain since May of this year and was diagnosed with gallstones at [facility].  Pain woke her up out of her sleep and was not associated with any meals. She was seen earlier in the day for evaluation of vaginal bleeding and lower abdominal pain and had a pelvic ultrasound which confirms an intrauterine pregnancy with gestational sac and yolk sac identified.  No embryo visualized at this time.  Follow-up ultrasound recommended. She denies having any fever, no chills, no changes in her bowel habits, no urinary symptoms, no headache, no dizziness, no lightheadedness, no chest pain, no shortness of breath, no palpitations, no diaphoresis, no blurred vision or focal deficit. She denies alcohol use. Sodium 134, potassium 4.2, chloride 103, bicarb 25, glucose 118, BUN 13, creatinine 0.47, calcium 9.2, alkaline phosphatase 92, albumin 4.3, lipase 33, AST 489, ALT 241, total protein 7.8, direct bilirubin 0.3, white count 10.9, hemoglobin 13, hematocrit 39.5, MCV 86.1, RDW 13.2, platelet count 239 Urine pregnancy test is positive Right upper quadrant ultrasound shows cholelithiasis without sonographic evidence of acute  cholecystitis.  Increased echogenicity of the liver.   ED Course: Patient is a 30 year old female with a known history of cholelithiasis who presents to the emergency room for evaluation of severe right upper quadrant pain associated with nausea and vomiting. She has transaminitis and gallbladder ultrasound shows evidence of cholelithiasis without cholecystitis. Gastroenterologist on-call was contacted and he recommends observation for further evaluation.     Review of Systems: As per HPI otherwise all other systems reviewed and negative.    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 2005 or 2006   due to accident; no vision right eye   SPINE SURGERY  ???2017   states surgery on spine due to a disc problem     reports that she quit smoking about a year ago. Her smoking use included cigarettes. She smoked an average of 1 pack per day. She has never used smokeless tobacco. She reports that she does not currently use alcohol. She reports  that she does not use drugs.  No Known Allergies  Family History  Problem Relation Age of Onset   Diabetes Mother    Miscarriages / [place] Mother        thinks mother had stillbirth   Miscarriages / [place] Sister        thinks sister had stillbirth   Healthy Brother    Asthma Daughter        states asthma hx but ok now   Heart murmur Son        born with heart murmur but states ok  now   Healthy Sister    Healthy Sister    Healthy Sister    Healthy Brother    Healthy Brother    Healthy Brother       Prior to Admission medications   Medication Sig Start Date End Date Taking? Authorizing Provider  misoprostol (CYTOTEC) 200 MCG tablet Take 4 tablets (800 mcg total) by mouth once for 1 dose. 12/27/20 12/27/20  Junious Dresser, FNP    Physical Exam: Vitals:   06/25/21 1206 06/25/21 1211 06/25/21 1518  BP: 113/61  (!) 108/59  Pulse: 77  63  Resp: 17  17  Temp:  98.2 F (36.8 C) 98 F (36.7 C)  TempSrc: Oral  Oral  SpO2: 98%  98%     Vitals:   06/25/21 1206 06/25/21 1211 06/25/21 1518  BP: 113/61  (!) 108/59  Pulse: 77  63  Resp: 17  17  Temp:  98.2 F (36.8 C) 98 F (36.7 C)  TempSrc: Oral  Oral  SpO2: 98%  98%      Constitutional: Alert and oriented x 3 . Not in any apparent distress HEENT:      Head: Normocephalic and atraumatic.         Eyes: PERLA, EOMI, Conjunctivae are normal. Sclera is non-icteric.       Mouth/Throat: Mucous membranes are moist.       Neck: Supple with no signs of meningismus. Cardiovascular: Regular rate and rhythm. No murmurs, gallops, or rubs. 2+ symmetrical distal pulses are present . No JVD. No LE edema Respiratory: Respiratory effort normal .Lungs sounds clear bilaterally. No wheezes, crackles, or rhonchi.  Gastrointestinal: Soft, RUQ tender, and non distended with positive bowel sounds.  Genitourinary: No CVA tenderness. Musculoskeletal: Nontender with normal range of motion in all extremities. No cyanosis, or erythema of extremities. Neurologic:  Face is symmetric. Moving all extremities. No gross focal neurologic deficits . Skin: Skin is warm, dry.  No rash or ulcers Psychiatric: Mood and affect are normal    Labs on Admission: I have personally reviewed following labs and imaging studies  CBC: Recent Labs  Lab 06/25/21 0603 06/25/21 1351  WBC 9.1 10.9*  NEUTROABS  --  7.7  HGB 13.3 13.0  HCT 39.9 39.5   MCV 86.2 86.1  PLT 255 301   Basic Metabolic Panel: Recent Labs  Lab 06/25/21 0603 06/25/21 1351  NA 135 134*  K 4.0 4.2  CL 107 103  CO2 21* 25  GLUCOSE 119* 118*  BUN 13 13  CREATININE 0.39* 0.47  CALCIUM 9.0 9.2   GFR: Estimated Creatinine Clearance: 99.8 mL/min (by C-G formula based on SCr of 0.47 mg/dL). Liver Function Tests: Recent Labs  Lab 06/25/21 1351  AST 489*  ALT 241*  ALKPHOS 92  BILITOT 0.7  PROT 7.8  ALBUMIN 4.3   Recent Labs  Lab 06/25/21 1351  LIPASE 33   No results for input(s):  AMMONIA in the last 168 hours. Coagulation Profile: No results for input(s): INR, PROTIME in the last 168 hours. Cardiac Enzymes: No results for input(s): CKTOTAL, CKMB, CKMBINDEX, TROPONINI in the last 168 hours. BNP (last 3 results) No results for input(s): PROBNP in the last 8760 hours. HbA1C: No results for input(s): HGBA1C in the last 72 hours. CBG: No results for input(s): GLUCAP in the last 168 hours. Lipid Profile: No results for input(s): CHOL, HDL, LDLCALC, TRIG, CHOLHDL, LDLDIRECT in the last 72 hours. Thyroid Function Tests: No results for input(s): TSH, T4TOTAL, FREET4, T3FREE, THYROIDAB in the last 72 hours. Anemia Panel: No results for input(s): VITAMINB12, FOLATE, FERRITIN, TIBC, IRON, RETICCTPCT in the last 72 hours. Urine analysis:    Component Value Date/Time   COLORURINE YELLOW (A) 06/25/2021 0603   APPEARANCEUR HAZY (A) 06/25/2021 0603   LABSPEC 1.020 06/25/2021 0603   PHURINE 5.0 06/25/2021 0603   GLUCOSEU NEGATIVE 06/25/2021 0603   HGBUR LARGE (A) 06/25/2021 0603   BILIRUBINUR NEGATIVE 06/25/2021 0603   KETONESUR NEGATIVE 06/25/2021 0603   PROTEINUR NEGATIVE 06/25/2021 0603   NITRITE NEGATIVE 06/25/2021 0603   LEUKOCYTESUR NEGATIVE 06/25/2021 0603    Radiological Exams on Admission: US OB LESS THAN 14 WEEKS WITH OB TRANSVAGINAL  Result Date: 06/25/2021 CLINICAL DATA:  Vaginal bleeding EXAM: OBSTETRIC <14 WK [place] AND TRANSVAGINAL  OB US TECHNIQUE: Both transabdominal and transvaginal ultrasound examinations were performed for complete evaluation of the gestation as well as the maternal uterus, adnexal regions, and pelvic cul-de-sac. Transvaginal technique was performed to assess early pregnancy. COMPARISON:  None. FINDINGS: Intrauterine gestational sac: Single Yolk sac:  Visualized. Embryo:  Not Visualized. MSD: 5.5 mm   5 w   2 d Subchorionic hemorrhage:  None visualized. Maternal uterus/adnexae: Unremarkable.  Trace free fluid. IMPRESSION: Intrauterine pregnancy with gestational sac and yolk sac identified. No embryo visualized at this time. Follow-up ultrasound recommended in 10 days. Trace free fluid. Electronically Signed   By: Macy Mis M.D.   On: 06/25/2021 07:27   US Abdomen Limited RUQ (LIVER/GB)  Result Date: 06/25/2021 CLINICAL DATA:  Right upper quadrant abdominal pain EXAM: ULTRASOUND ABDOMEN LIMITED RIGHT UPPER QUADRANT COMPARISON:  None. FINDINGS: Gallbladder: Echogenic, shadowing stone within the gallbladder lumen measuring up to 1.8 cm in diameter. No pericholecystic fluid or wall thickening visualized. No sonographic Murphy sign noted by sonographer. Common bile duct: Diameter: 6.6 mm, upper limits of normal. Liver: No focal lesion identified. Diffusely increased hepatic parenchymal echogenicity. Portal vein is patent on color Doppler imaging with normal direction of blood flow towards the liver. Other: None. IMPRESSION: 1. Cholelithiasis without sonographic evidence of acute cholecystitis. 2. The echogenicity of the liver is increased. This is a nonspecific finding but is most commonly seen with fatty infiltration of the liver. There are no obvious focal liver lesions. Electronically Signed   By: Davina Poke D.O.   On: 06/25/2021 14:43     Assessment/Plan Principal Problem:   Cholelithiasis Active Problems:   Abdominal pain   Transaminitis   Biliary colic   Normal IUP (intrauterine pregnancy) on  prenatal ultrasound     Patient is a 30 year old female with a history of cholelithiasis who presents for evaluation of abdominal pain   Biliary colic Secondary to known cholelithiasis Pain control Supportive care with IV fluids, antiemetics and IV PPI Patient with significant transaminitis but with normal total bilirubin and alk phos levels GI consult    Intrauterine pregnancy Patient noted to be [redacted] weeks pregnant Follow-up with OB  as an outpatient  DVT prophylaxis: SCD  Code Status: full code  Family Communication: Greater than 50% of time was spent discussing patient's condition and plan of care with her at the bedside.  All questions and concerns have been addressed.  She verbalizes understanding and agrees with the plan. Disposition Plan: Back to previous home environment Consults called: Gastroenterology Status:Observation    Collier Bullock MD [facility]     06/25/2021, 4:37 PM

## 2021-06-25 NOTE — Consult Note (Addendum)
Inpatient Consultation   Patient ID: Gabrielle Romero is a 30 y.o. female.  Requesting Provider: Dr. Cherylann Banas- [facility]  Date of Admission: 06/25/2021  Date of Consult: 06/25/21   Reason for Consultation: transaminitis, cholelithiasis  Patient's Chief Complaint:   Chief Complaint  Patient presents with   Abdominal Pain    Ms Gabrielle Romero is a 30 year old Hispanic female who is [redacted] weeks pregnant who initially presented earlier today for vaginal bleeding and was discharged.  She now presents for right upper quadrant pain that started at approximately 11 AM with nausea.  GI is consulted for cholelithiasis and transaminitis Translation is provided by nursing medical interpreter at bedside.  Husband accompanies at bedside.  Patient notes intermittent right upper quadrant pain since May of this year.  Today this became worse with elevation in AST at 489 ALT 241 with normal total bilirubin 0.7 normal INR at 1.0 and normal alk phos in the 90s.  Abdominal ultrasound has been performed now and in the past demonstrating cholelithiasis.  Today there is no gallbladder wall thickening or pericholecystic fluid.  The common bile duct is 6.6 mm.  No obvious stone or blockage on ultrasound.  Hepatic steatosis noted.  She denies any pruritus, jaundice, fever, chills.  She did have some nausea. Gallstones were initially diagnosed in May at [facility].  She denies any pain postprandially.  No other alleviating or worsening factors Denies alcohol use and intranasal or IV drug use. 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 2005 or 2006   due to accident; no vision right eye   SPINE SURGERY  ???2017   states surgery on spine due to a disc problem    No Known Allergies  Family History  Problem Relation Age of Onset   Diabetes Mother    Miscarriages / [place] Mother        thinks mother had stillbirth   Miscarriages / [place] Sister        thinks sister had stillbirth   Healthy Brother    Asthma Daughter        states asthma hx but ok now   Heart murmur Son        born with heart murmur but states ok now   Healthy Sister    Healthy Sister    Healthy Sister    Healthy Brother    Healthy Brother    Healthy Brother     Social History   Tobacco Use   Smoking status: Former    Packs/day: 1.00    Types: Cigarettes    Quit date: 07/2020    Years since quitting: 0.9  Smokeless tobacco: Never   Tobacco comments:    3 years ago states smoked 1 PPD: quit 07/2020 and was smoking 3-4 cigarettes per day   Vaping Use   Vaping Use: Former   Start date: 06/14/2020   Quit date: 06/14/2020  Substance Use Topics   Alcohol use: Not Currently    Comment: last use- 07/2020- glass of wine; during phone interview 11/28/20 states drank whiskey before she knew she was pregnant   Drug use: Never    Comment: denies     Pertinent GI related history and allergies were reviewed with the patient  Review of Systems  Constitutional:  Negative for activity change, appetite change, chills, diaphoresis, fatigue, fever and unexpected weight change.  HENT:  Negative for trouble swallowing and voice change.   Respiratory:  Negative for shortness of breath and wheezing.    Cardiovascular:  Negative for chest pain, palpitations and leg swelling.  Gastrointestinal:  Positive for abdominal pain (ruq) and nausea. Negative for abdominal distention, anal bleeding, blood in stool, constipation, diarrhea, rectal pain and vomiting.  Genitourinary:  Positive for vaginal bleeding.  Musculoskeletal:  Negative for arthralgias and myalgias.  Skin:  Negative for color change, pallor and rash.       No itching  Neurological:  Negative for dizziness, syncope and weakness.  Psychiatric/Behavioral:  Negative for confusion.   All other systems reviewed and are negative.   Medications Home Medications No current facility-administered medications on file prior to encounter.   Current Outpatient Medications on File Prior to Encounter  Medication Sig Dispense Refill   misoprostol (CYTOTEC) 200 MCG tablet Take 4 tablets (800 mcg total) by mouth once for 1 dose. 4 tablet 0   Pertinent GI related medications were reviewed with the patient  Inpatient Medications  Current Facility-Administered Medications:    0.9 %  sodium chloride infusion, , Intravenous, Continuous, Agbata, Tochukwu, MD   metoCLOPramide (REGLAN) injection 10 mg, 10 mg, Intravenous, Q8H PRN, Agbata, Tochukwu, MD   pantoprazole (PROTONIX) injection 40 mg, 40 mg, Intravenous, Daily, Agbata, Tochukwu, MD  Current Outpatient Medications:    misoprostol (CYTOTEC) 200 MCG tablet, Take 4 tablets (800 mcg total) by mouth once for 1 dose., Disp: 4 tablet, Rfl: 0  sodium chloride      metoCLOPramide (REGLAN) injection   Objective   Vitals:   06/25/21 1206 06/25/21 1211 06/25/21 1518 06/25/21 1731  BP: 113/61  (!) 108/59 106/78  Pulse: 77  63 60  Resp: _0 Temp:  98.2 F (36.8 C) 98 F (36.7 C) 98 F (36.7 C)  TempSrc: Oral  Oral Oral  SpO2: 98%  98% 97%     Physical Exam Vitals and nursing note reviewed.  Constitutional:      General: She is not in acute distress.    Appearance: Normal  appearance. She is well-developed. She is not ill-appearing, toxic-appearing or diaphoretic.     Comments: overweight  HENT:     Head: Normocephalic and atraumatic.     Nose: Nose normal.     Mouth/Throat:     Mouth: Mucous membranes are moist.     Pharynx: Oropharynx is clear.     Comments: No sublingual jaundice Eyes:     General: No scleral icterus.    Extraocular Movements: Extraocular movements intact.  Cardiovascular:     Rate and Rhythm: Normal rate and regular rhythm.     Heart sounds: Normal heart sounds. No murmur heard.   No friction rub. No gallop.  Pulmonary:     Effort: Pulmonary effort is normal. No respiratory distress.     Breath sounds: Normal breath sounds. No wheezing, rhonchi or rales.  Abdominal:     General: Bowel sounds are normal. There is no distension.     Palpations: Abdomen is soft.     Tenderness: There is abdominal tenderness (RUQ, non peritoneal. no rigidity). There is no guarding or rebound.  Musculoskeletal:     Cervical back: Neck supple.     Right lower leg: No edema.     Left lower leg: No edema.  Skin:    General: Skin is warm and dry.     Coloration: Skin is not jaundiced or pale.  Neurological:     General: No focal deficit present.     Mental Status: She is alert and oriented to person, place, and time. Mental status is at baseline.  Psychiatric:        Mood and Affect: Mood normal.        Behavior: Behavior normal.        Thought Content: Thought content normal.        Judgment: Judgment normal.    Laboratory Data Recent Labs  Lab 06/25/21 0603 06/25/21 1351  WBC 9.1 10.9*  HGB 13.3 13.0  HCT 39.9 39.5  PLT 255 239   Recent Labs  Lab 06/25/21 0603 06/25/21 1351  NA 135 134*  K 4.0 4.2  CL 107 103  CO2 21* 25  BUN 13 13  CALCIUM 9.0 9.2  PROT  --  7.8  BILITOT  --  0.7  ALKPHOS  --  92  ALT  --  241*  AST  --  489*  GLUCOSE 119* 118*   Recent Labs  Lab 06/25/21 1400  INR 1.0    Recent Labs     06/25/21 1351  LIPASE 33        Imaging Studies: US OB LESS THAN 14 WEEKS WITH OB TRANSVAGINAL  Result Date: 06/25/2021 CLINICAL DATA:  Vaginal bleeding EXAM: OBSTETRIC <14 WK [place] AND TRANSVAGINAL OB US TECHNIQUE: Both transabdominal and transvaginal ultrasound examinations were performed for complete evaluation of the gestation as well as the maternal uterus, adnexal regions, and pelvic cul-de-sac. Transvaginal technique was performed to assess early pregnancy. COMPARISON:  None. FINDINGS: Intrauterine gestational sac: Single Yolk sac:  Visualized. Embryo:  Not Visualized. MSD: 5.5 mm   5 w   2 d Subchorionic hemorrhage:  None visualized. Maternal uterus/adnexae: Unremarkable.  Trace free fluid. IMPRESSION: Intrauterine pregnancy with gestational sac and yolk sac identified. No embryo visualized at this time. Follow-up ultrasound recommended in 10 days. Trace free fluid. Electronically Signed   By: Macy Mis M.D.   On: 06/25/2021 07:27   US Abdomen Limited RUQ (LIVER/GB)  Result Date: 06/25/2021 CLINICAL DATA:  Right upper quadrant abdominal pain EXAM: ULTRASOUND ABDOMEN LIMITED RIGHT UPPER QUADRANT COMPARISON:  None. FINDINGS: Gallbladder: Echogenic, shadowing stone within the gallbladder lumen measuring up to 1.8 cm in diameter. No pericholecystic fluid or wall thickening visualized. No sonographic Murphy sign noted by sonographer. Common bile duct: Diameter: 6.6 mm, upper limits of normal. Liver: No focal lesion identified. Diffusely increased hepatic parenchymal echogenicity. Portal vein is patent on color Doppler imaging with normal direction of blood flow towards the liver. Other: None. IMPRESSION: 1. Cholelithiasis without sonographic evidence of acute cholecystitis. 2. The echogenicity of the liver is increased. This is a nonspecific finding but is most commonly seen with fatty infiltration of the liver. There  are no obvious focal liver lesions. Electronically Signed   By: Davina Poke D.O.   On: 06/25/2021 14:43    Assessment:   # Acute hepatitis - viral panel pending - Total bili and alk phos wnl - ast 489, alt 241 - pt denies etoh use, tylenol use - inr normal at 1.0; alk phos 92, plt 239 - only [redacted] weeks gestation per note - lipase 33 - no new medications/dose changes - pt denies etoh use - hyperemesis gravidarum of consideration, but ruq pain was preceding LFT elevation by months - no other signs of htn or end organ damage - portal vein patent on ultrasound  # Cholelithiasis - CBD is 6.6, upper limit of normal- consider sludge vs stone in CBD- normal tb and AP - afebrile, no leukcytosis or fever. TB and AP normal - no gb wall thickening or inflammation appreciated on [place]  # RUQ pain - chronic since May 2022- now acutely worsened today - was seen earlier today for lower abdominal pain and vaginal bleeding  # intrauterine pregnancy  Plan:  Viral hepatitis and HIV panel pending; If viral hepatitis negative- consider MRI MRCP- however, would consult OB Gyn at that time; Given first trimester status- conservative medical management would be recommended Autoimmune markers to be checked Tylenol also being checked Monitor LFTs, plts, and INR daily If patient develops fever, leukocytosis, confusion, hyperbilirubinemia recommend initiation of antibiotics (zosyn) Ok for sips and chips at this time Anti emetics, pain control, and supportive care as per primary team  GI will follow  I personally performed the service.  Management of other medical comorbidities as per primary team  Thank you for allowing [place] to participate in this patient's care. Please don't hesitate to call if any questions or concerns arise.   Annamaria Helling, DO [facility]  Portions of the record may have been created with voice recognition software. Occasional wrong-word or 'sound-a-like' substitutions may have occurred due to the inherent limitations of  voice recognition software.  Read the chart carefully and recognize, using context, where substitutions may have occurred.

## 2021-06-25 NOTE — Discharge Instructions (Signed)
Contact [facility] health department today to schedule a follow-up ultrasound in 10 days.

## 2021-06-25 NOTE — ED Provider Notes (Signed)
[facility] Emergency Department Provider Note ____________________________________________   Event Date/Time   First MD Initiated Contact with Patient 06/25/21 1327     (approximate)  I have reviewed the triage vital signs and the nursing notes.   HISTORY  Chief Complaint Abdominal Pain  HPI and ROS obtained via video interpreter  HPI Elveta Ryen Heitmeyer is a 30 y.o. female currently [redacted] weeks pregnant who presents with right upper quadrant pain, acute onset around 11 AM today, persistent course since then, associated with nausea and vomiting.  She denies any associated diarrhea or fever.  She had some vaginal bleeding earlier and has some lower abdominal cramps but no acute pain there.  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 2005 or 2006   due to accident; no vision right eye   SPINE SURGERY  ???2017   states surgery on spine due to a disc problem    Prior to Admission medications   Medication Sig Start Date End Date Taking? Authorizing Provider  misoprostol (CYTOTEC) 200 MCG tablet Take 4 tablets (800 mcg total) by mouth once for 1 dose. 12/27/20 12/27/20  Junious Dresser, FNP    Allergies Patient has no known allergies.  Family History  Problem Relation Age of Onset   Diabetes Mother    Miscarriages /  [place] Mother        thinks mother had stillbirth   Miscarriages / [place] Sister        thinks sister had stillbirth   Healthy Brother    Asthma Daughter        states asthma hx but ok now   Heart murmur Son        born with heart murmur but states ok now   Healthy Sister    Healthy Sister    Healthy Sister    Healthy Brother    Healthy Brother    Healthy Brother     Social History Social History   Tobacco Use   Smoking status: Former    Packs/day: 1.00    Types: Cigarettes    Quit date: 07/2020    Years since quitting: 0.9   Smokeless tobacco: Never   Tobacco comments:    3 years ago states smoked 1 PPD: quit 07/2020 and was smoking 3-4 cigarettes per day   Vaping Use   Vaping Use: Former   Start date: 06/14/2020   Quit date: 06/14/2020  Substance Use Topics   Alcohol use: Not Currently    Comment: last use- 07/2020- glass of wine; during phone interview 11/28/20 states drank whiskey before she knew she was pregnant   Drug use: Never    Comment: denies    Review of Systems  Constitutional: No fever/chills Eyes: No visual changes. ENT: No sore throat. Cardiovascular: Denies chest pain. Respiratory: Denies shortness of breath. Gastrointestinal: Positive for nausea and vomiting.  No diarrhea.  Genitourinary: Negative for dysuria.  Musculoskeletal: Negative for back pain. Skin: Negative for rash. Neurological: Negative for headaches, focal weakness or numbness.   ____________________________________________   PHYSICAL EXAM:  VITAL SIGNS: ED Triage Vitals  Enc Vitals Group     BP 06/25/21 1206 113/61     Pulse Rate 06/25/21 1206 77     Resp 06/25/21 1206 17     Temp 06/25/21 1211 98.2 F (36.8 C)     Temp Source 06/25/21 1206 Oral     SpO2 06/25/21 1206 98 %     Weight --      Height --      Head Circumference --      Peak Flow --      Pain Score 06/25/21 1209 7     Pain Loc --      Pain Edu? --      Excl. in [place]? --     Constitutional:  Alert and oriented.  Slightly uncomfortable appearing but in no acute distress. Eyes: Conjunctivae are normal.  Head: Atraumatic. Nose: No congestion/rhinnorhea. Mouth/Throat: Mucous membranes are moist.   Neck: Normal range of motion.  Cardiovascular: Normal rate, regular rhythm. Good peripheral circulation. Respiratory: Normal respiratory effort.  No retractions.  Gastrointestinal: Soft with mild upper quadrant tenderness.  Lower abdomen soft and nontender.  No distention.  Genitourinary: No flank tenderness. Musculoskeletal: Extremities warm and well perfused.  Neurologic:  Normal speech and language. No gross focal neurologic deficits are appreciated.  Skin:  Skin is warm and dry. No rash noted. Psychiatric: Mood and affect are normal. Speech and behavior are normal.  ____________________________________________   LABS (all labs ordered are listed, but only abnormal results are displayed)  Labs Reviewed  BASIC METABOLIC PANEL - Abnormal; Notable for the following components:      Result Value   Sodium 134 (*)    Glucose, Bld 118 (*)    All other components within normal limits  CBC WITH DIFFERENTIAL/PLATELET - Abnormal; Notable for the following components:   WBC 10.9 (*)    All other components within normal limits  HEPATIC FUNCTION PANEL - Abnormal; Notable for the following components:   AST 489 (*)    ALT 241 (*)    Bilirubin, Direct 0.3 (*)    All other components within normal limits  LIPASE, BLOOD  HEPATITIS PANEL, ACUTE   ____________________________________________  EKG   ____________________________________________  RADIOLOGY  US abdomen RUQ: IMPRESSION:  1. Cholelithiasis without sonographic evidence of acute  cholecystitis.  2. The echogenicity of the liver is increased. This is a nonspecific  finding but is most commonly seen with fatty infiltration of the  liver. There are no obvious focal liver lesions.     ____________________________________________   PROCEDURES  Procedure(s) performed: No  Procedures  Critical Care performed: No ____________________________________________   INITIAL IMPRESSION / ASSESSMENT AND PLAN / ED COURSE  Pertinent labs & imaging results that were available during my care of the patient were reviewed by me and considered in my medical decision making (see chart for details).   30 year old female with PMH as noted above, currently [redacted] weeks pregnant presents with acute onset of right upper quadrant pain with nausea and vomiting.  I reviewed the past medical records in epic.  The patient was evaluated in the ED here earlier this  morning for vaginal bleeding and some lower abdominal cramping.  Ultrasound confirmed a gestational sac and yolk sac consistent with a 5-week 2-day pregnancy.  The patient has known gallstones but has not had any abdominal surgeries in the past.  On exam the patient has normal vital signs and is somewhat tender in the right upper quadrant.  Physical exam is otherwise unremarkable.  Differential includes biliary colic, acute cholecystitis, acute hepatitis, pancreatitis, gastroenteritis, colitis.  There is no evidence of acute pregnancy related complication; the patient has no active lower abdominal pain or tenderness in the lower abdomen.  We will obtain lab work-up and a right upper quadrant ultrasound, and give tylenol for pain since the patient is pregnant.   ----------------------------------------- 3:58 PM on 06/25/2021 -----------------------------------------  Ultrasound reveals cholelithiasis without evidence of acute cholecystitis.  Labs are notable for significant elevated LFTs but a normal alk phos and total bilirubin.  I consulted Dr. Virgina Jock from gastroenterology who recommends obtaining a hepatitis panel and states he will evaluate the patient and may recommend an MRCP.  He recommends admission to the hospital service.  I  then consulted Dr. Francine Graven from the hospitalist service for admission.   ____________________________________________   FINAL CLINICAL IMPRESSION(S) / ED DIAGNOSES  Final diagnoses:  Right upper quadrant abdominal pain  Elevated LFTs      NEW MEDICATIONS STARTED DURING THIS VISIT:  New Prescriptions   No medications on file     Note:  This document was prepared using Dragon voice recognition software and may include unintentional dictation errors.    Arta Silence, MD 06/25/21 1600

## 2021-06-25 NOTE — ED Triage Notes (Signed)
Pt c/o RUQ with N/V for the past 1.5hrs, states has dx with gall stones at [facility], states she is about [redacted] weeks pregnant and was seen this morning for vaginal bleeding and now returned with abd pain

## 2021-06-26 ENCOUNTER — Telehealth: Payer: Self-pay

## 2021-06-26 ENCOUNTER — Observation Stay: Payer: Self-pay

## 2021-06-26 DIAGNOSIS — O26611 Liver and biliary tract disorders in pregnancy, first trimester: Secondary | ICD-10-CM | POA: Diagnosis present

## 2021-06-26 DIAGNOSIS — O209 Hemorrhage in early pregnancy, unspecified: Secondary | ICD-10-CM | POA: Diagnosis present

## 2021-06-26 DIAGNOSIS — O99611 Diseases of the digestive system complicating pregnancy, first trimester: Secondary | ICD-10-CM | POA: Diagnosis present

## 2021-06-26 DIAGNOSIS — Z87891 Personal history of nicotine dependence: Secondary | ICD-10-CM | POA: Diagnosis not present

## 2021-06-26 DIAGNOSIS — Z3A01 Less than 8 weeks gestation of pregnancy: Secondary | ICD-10-CM | POA: Diagnosis not present

## 2021-06-26 DIAGNOSIS — R1011 Right upper quadrant pain: Secondary | ICD-10-CM | POA: Diagnosis present

## 2021-06-26 DIAGNOSIS — Z825 Family history of asthma and other chronic lower respiratory diseases: Secondary | ICD-10-CM | POA: Diagnosis not present

## 2021-06-26 DIAGNOSIS — R7989 Other specified abnormal findings of blood chemistry: Secondary | ICD-10-CM

## 2021-06-26 DIAGNOSIS — K8080 Other cholelithiasis without obstruction: Secondary | ICD-10-CM

## 2021-06-26 DIAGNOSIS — K7200 Acute and subacute hepatic failure without coma: Secondary | ICD-10-CM | POA: Diagnosis present

## 2021-06-26 DIAGNOSIS — Z833 Family history of diabetes mellitus: Secondary | ICD-10-CM | POA: Diagnosis not present

## 2021-06-26 DIAGNOSIS — K8070 Calculus of gallbladder and bile duct without cholecystitis without obstruction: Secondary | ICD-10-CM | POA: Diagnosis present

## 2021-06-26 DIAGNOSIS — Z20822 Contact with and (suspected) exposure to covid-19: Secondary | ICD-10-CM | POA: Diagnosis present

## 2021-06-26 LAB — CBC
HCT: 36 % (ref 36.0–46.0)
Hemoglobin: 11.8 g/dL — ABNORMAL LOW (ref 12.0–15.0)
MCH: 28 pg (ref 26.0–34.0)
MCHC: 32.8 g/dL (ref 30.0–36.0)
MCV: 85.5 fL (ref 80.0–100.0)
Platelets: 230 10*3/uL (ref 150–400)
RBC: 4.21 MIL/uL (ref 3.87–5.11)
RDW: 13.2 % (ref 11.5–15.5)
WBC: 4.5 10*3/uL (ref 4.0–10.5)
nRBC: 0 % (ref 0.0–0.2)

## 2021-06-26 LAB — COMPREHENSIVE METABOLIC PANEL
ALT: 736 U/L — ABNORMAL HIGH (ref 0–44)
AST: 608 U/L — ABNORMAL HIGH (ref 15–41)
Albumin: 3.3 g/dL — ABNORMAL LOW (ref 3.5–5.0)
Alkaline Phosphatase: 116 U/L (ref 38–126)
Anion gap: 2 — ABNORMAL LOW (ref 5–15)
BUN: 7 mg/dL (ref 6–20)
CO2: 26 mmol/L (ref 22–32)
Calcium: 8.2 mg/dL — ABNORMAL LOW (ref 8.9–10.3)
Chloride: 109 mmol/L (ref 98–111)
Creatinine, Ser: 0.48 mg/dL (ref 0.44–1.00)
GFR, Estimated: >60 mL/min (ref >60)
Glucose, Bld: 101 mg/dL — ABNORMAL HIGH (ref 70–99)
Potassium: 4.5 mmol/L (ref 3.5–5.1)
Sodium: 137 mmol/L (ref 135–145)
Total Bilirubin: 1.8 mg/dL — ABNORMAL HIGH (ref 0.3–1.2)
Total Protein: 6.3 g/dL — ABNORMAL LOW (ref 6.5–8.1)

## 2021-06-26 LAB — APTT: aPTT: 29 seconds (ref 24–36)

## 2021-06-26 LAB — D-DIMER, QUANTITATIVE: D-Dimer, Quant: 0.72 ug/mL-FEU — ABNORMAL HIGH (ref 0.00–0.50)

## 2021-06-26 LAB — HEPATITIS PANEL, ACUTE
HCV Ab: NONREACTIVE
Hep A IgM: NONREACTIVE
Hep B C IgM: NONREACTIVE
Hepatitis B Surface Ag: NONREACTIVE

## 2021-06-26 LAB — PROTIME-INR
INR: 1.1 (ref 0.8–1.2)
Prothrombin Time: 13.9 seconds (ref 11.4–15.2)

## 2021-06-26 LAB — HIV ANTIBODY (ROUTINE TESTING W REFLEX): HIV Screen 4th Generation wRfx: NONREACTIVE

## 2021-06-26 LAB — LACTATE DEHYDROGENASE: LDH: 159 U/L (ref 98–192)

## 2021-06-26 LAB — FIBRINOGEN: Fibrinogen: 304 mg/dL (ref 210–475)

## 2021-06-26 NOTE — Progress Notes (Addendum)
Inpatient Follow-up/Progress Note   Patient ID: Gabrielle Romero is a 30 y.o. female.  Overnight Events / Subjective Findings Spoke with patient via video medical interpreter.  Overnight events discussed with nursing.  Husband is at bedside Patient's pain is improved today and is without nausea and vomiting.  Labs are currently pending.  Acute viral hepatitis panel negative. Afebrile and vital signs stable overnight Tolerated p.o. overnight.  Is currently n.p.o. pending further lab results to determine if MRI MRCP is necessary. No other acute GI complaints   Review of Systems  Constitutional:  Negative for activity change, appetite change, chills, diaphoresis, fatigue, fever and unexpected weight change.  HENT:  Negative for trouble swallowing and voice change.   Respiratory:  Negative for shortness of breath and wheezing.   Cardiovascular:  Negative for chest pain and palpitations.  Gastrointestinal:  Positive for abdominal pain (improved) and nausea (resolved). Negative for abdominal distention, anal bleeding, blood in stool, constipation, diarrhea, rectal pain and vomiting.  Musculoskeletal:  Negative for arthralgias and myalgias.  Skin:  Negative for color change and pallor.  Neurological:  Negative for dizziness and weakness.  Psychiatric/Behavioral:  Negative for confusion.   All other systems reviewed and are negative.   Medications  Current Facility-Administered Medications:    0.9 %  sodium chloride infusion, , Intravenous, Continuous, Agbata, Tochukwu, MD, Last Rate: 75 mL/hr at 06/25/21 1841, New Bag at 06/25/21 1841   acetaminophen (TYLENOL) tablet 650 mg, 650 mg, Oral, Q6H PRN, Cox, Amy N, DO, 650 mg at 06/25/21 1954   docusate sodium (COLACE) capsule 100 mg, 100 mg, Oral, BID, Cox, Amy N, DO, 100 mg at 06/25/21 1954   metoCLOPramide (REGLAN) injection 10 mg, 10 mg, Intravenous, Q8H PRN, Agbata, Tochukwu, MD   oxyCODONE-acetaminophen (PERCOCET) 7.5-325 MG per  tablet 1 tablet, 1 tablet, Oral, Q8H PRN, Cox, Amy N, DO, 1 tablet at 06/25/21 2244   pantoprazole (PROTONIX) injection 40 mg, 40 mg, Intravenous, Daily, Agbata, Tochukwu, MD, 40 mg at 06/25/21 1845  sodium chloride 75 mL/hr at 06/25/21 1841    acetaminophen, metoCLOPramide (REGLAN) injection, oxyCODONE-acetaminophen   Objective    Vitals:   06/25/21 1731 06/25/21 2019 06/26/21 0019 06/26/21 0412  BP: 106/78 103/64 107/62 112/70  Pulse: 60 69 76 70  Resp: _0 Temp: 98 F (36.7 C) 98.2 F (36.8 C) 98.1 F (36.7 C) 98 F (36.7 C)  TempSrc: Oral Oral Oral Oral  SpO2: 97% 99% 100% 100%     Physical Exam Vitals and nursing note reviewed.  Constitutional:      General: She is not in acute distress.    Appearance: Normal appearance. She is not ill-appearing, toxic-appearing or diaphoretic.  HENT:     Head: Normocephalic and atraumatic.     Nose: Nose normal.     Mouth/Throat:     Mouth: Mucous membranes are moist.     Pharynx: Oropharynx is clear.  Eyes:     General: No scleral icterus.    Extraocular Movements: Extraocular movements intact.  Cardiovascular:     Rate and Rhythm: Normal rate and regular rhythm.     Heart sounds: Normal heart sounds. No murmur heard.   No friction rub. No gallop.  Pulmonary:     Effort: Pulmonary effort is normal. No respiratory distress.     Breath sounds: Normal breath sounds. No wheezing, rhonchi or rales.  Abdominal:     General: Bowel sounds are normal. There is no distension.  Palpations: Abdomen is soft.     Tenderness: There is abdominal tenderness (mild ruq- improved). There is no guarding or rebound.     Comments: No rigidity, non peritoneal  Musculoskeletal:     Cervical back: Neck supple.     Right lower leg: No edema.     Left lower leg: No edema.  Skin:    General: Skin is warm and dry.     Coloration: Skin is not jaundiced or pale.  Neurological:     General: No focal deficit present.     Mental Status: She  is alert and oriented to person, place, and time. Mental status is at baseline.  Psychiatric:        Mood and Affect: Mood normal.        Behavior: Behavior normal.        Thought Content: Thought content normal.        Judgment: Judgment normal.     Laboratory Data Recent Labs  Lab 06/25/21 0603 06/25/21 1351  WBC 9.1 10.9*  HGB 13.3 13.0  HCT 39.9 39.5  PLT 255 239  NEUTOPHILPCT  --  70  LYMPHOPCT  --  19  MONOPCT  --  9  EOSPCT  --  1   Recent Labs  Lab 06/25/21 0603 06/25/21 1351  NA 135 134*  K 4.0 4.2  CL 107 103  CO2 21* 25  BUN 13 13  CREATININE 0.39* 0.47  CALCIUM 9.0 9.2  PROT  --  7.8  BILITOT  --  0.7  ALKPHOS  --  92  ALT  --  241*  AST  --  489*  GLUCOSE 119* 118*   Recent Labs  Lab 06/25/21 1400  INR 1.0      Imaging Studies: US OB LESS THAN 14 WEEKS WITH OB TRANSVAGINAL  Result Date: 06/25/2021 CLINICAL DATA:  Vaginal bleeding EXAM: OBSTETRIC <14 WK [place] AND TRANSVAGINAL OB US TECHNIQUE: Both transabdominal and transvaginal ultrasound examinations were performed for complete evaluation of the gestation as well as the maternal uterus, adnexal regions, and pelvic cul-de-sac. Transvaginal technique was performed to assess early pregnancy. COMPARISON:  None. FINDINGS: Intrauterine gestational sac: Single Yolk sac:  Visualized. Embryo:  Not Visualized. MSD: 5.5 mm   5 w   2 d Subchorionic hemorrhage:  None visualized. Maternal uterus/adnexae: Unremarkable.  Trace free fluid. IMPRESSION: Intrauterine pregnancy with gestational sac and yolk sac identified. No embryo visualized at this time. Follow-up ultrasound recommended in 10 days. Trace free fluid. Electronically Signed   By: Macy Mis M.D.   On: 06/25/2021 07:27   US Abdomen Limited RUQ (LIVER/GB)  Result Date: 06/25/2021 CLINICAL DATA:  Right upper quadrant abdominal pain EXAM: ULTRASOUND ABDOMEN LIMITED RIGHT UPPER QUADRANT COMPARISON:  None. FINDINGS: Gallbladder: Echogenic, shadowing stone  within the gallbladder lumen measuring up to 1.8 cm in diameter. No pericholecystic fluid or wall thickening visualized. No sonographic Murphy sign noted by sonographer. Common bile duct: Diameter: 6.6 mm, upper limits of normal. Liver: No focal lesion identified. Diffusely increased hepatic parenchymal echogenicity. Portal vein is patent on color Doppler imaging with normal direction of blood flow towards the liver. Other: None. IMPRESSION: 1. Cholelithiasis without sonographic evidence of acute cholecystitis. 2. The echogenicity of the liver is increased. This is a nonspecific finding but is most commonly seen with fatty infiltration of the liver. There are no obvious focal liver lesions. Electronically Signed   By: Davina Poke D.O.   On: 06/25/2021 14:43    Assessment:  #  Acute hepatitis - viral panel negative - HIV testing pending; AI w/u pending - Total bili and alk phos wnl - ast 489, alt 241 - pt denies etoh use, tylenol use  - tylenol level 12 from administration in ED  - salicylate negative - inr normal at 1.0; alk phos 92, plt 239 - only [redacted] weeks gestation per note - lipase 33 - no new medications/dose changes - hyperemesis gravidarum of consideration, but ruq pain was preceding LFT elevation by months - no other signs of htn or end organ damage - portal vein patent on ultrasound - ferritin wnl, covid 19 negative   # Cholelithiasis - CBD is 6.6, upper limit of normal- consider sludge vs stone in CBD- normal tb and AP - afebrile, no leukcytosis or fever. TB and AP normal - no gb wall thickening or inflammation appreciated on [place]   # RUQ pain- improved today - chronic since May 2022- now acutely worsened today - was seen earlier today for lower abdominal pain and vaginal bleeding   # intrauterine pregnancy  Plan:  Labs pending this morning including LFTs, HIV Pending LFTs, if improved, can hold off of MRI MRCP and manage conservatively. If same or worsening, will order.  NPO in anticipation of potential MRCP D/w OB on call to confirm imaging modality without contrast safe in this patient Given first trimester status- conservative medical management would be recommended AI w/u is currently in progress Plt wnl today If patient develops fever, leukocytosis, confusion, hyperbilirubinemia recommend initiation of antibiotics (zosyn) Anti emetics, pain control, and supportive care as per primary team If liver enzymes improving, patient can potentially go home pending symptom improvement with short term f/u with PCP/OB for LFT check and AI work up f/u. Can also arrange for outpatient GI follow-up Plan communicated with nursing staff and primary team   **Addendum- LFTs increasing ast 608, alt 736, tb 1.8, ap 116; will order MRI MRCP w/o  I personally performed the service.  Management of other medical comorbidities as per primary team  Thank you for allowing [place] to participate in this patient's care. Please don't hesitate to call if any questions or concerns arise.   Annamaria Helling, DO [facility]  Portions of the record may have been created with voice recognition software. Occasional wrong-word or 'sound-a-like' substitutions may have occurred due to the inherent limitations of voice recognition software.  Read the chart carefully and recognize, using context, where substitutions may have occurred.

## 2021-06-26 NOTE — Telephone Encounter (Signed)
Call transferred to clinic by Lane Hacker who interpreted during call. Per client, had [place] yesterday and due to results, follow-up US recommended in 10 days. Per client, she was told to call [facility] to schedule [place]. Client currently is not a [facility] client and with recent hospitalization at [facility] where remains admitted with possible liver issues. Client counseled that RN would request provider review record and notify client regarding [place]. Jossie Ng, RN

## 2021-06-26 NOTE — Progress Notes (Addendum)
PROGRESS NOTE  Gabrielle Romero    DOB: 1990/10/05, 30 y.o.  SEG:[id number]  PCP: Department, [facility]   Code Status: Full Code   DOA: 06/25/2021   LOS: 0  Brief Narrative of Current Hospitalization  Gabrielle Romero is a 30 y.o. female with a PMH significant for current intrauterine pregnancy- [redacted]w[redacted]d via LMP without prenatal care at this point. US showed intrauterine sac without embryo 12/12. Her G&Ps are unknown due to several miscarriages in past. She has no other significant medical history other than intermittent RUQ pain since May attributed to cholelithiasis.  They presented from home to the ED on 06/25/2021 with repeat RUQ pain. In the ED, it was found that they had cholelithiasis without signs of cholecystitis on imaging. Additionally, she had elevated liver enzymes. Her platelets and blood pressure were normal. GI was consulted.  Patient was admitted to medicine service for further workup and management of RUQ pain as outlined in detail below.  06/26/21 -stable, improved  Assessment & Plan  Principal Problem:   Cholelithiasis Active Problems:   Abdominal pain   Transaminitis   Biliary colic   Normal IUP (intrauterine pregnancy) on prenatal ultrasound  RUQ pain  cholelithiasis  transaminitis- AST/ALT increased today although patient denies RUQ pain this am.  Acute hepatitis panel negative. - GI following, appreciate recs  - MRCP 12/13 - CMP/CBC am - continue to monitor BP closely - analgesia PRN - advance diet as tolerated after procedure  1st trimester IUP-  - follow up with PCP OP - prenatal vitamin - repeat US next week  DVT prophylaxis: SCDs Start: 06/25/21 1619  Diet:  Diet Orders (From admission, onward)     Start     Ordered   06/25/21 1619  Diet clear liquid Room service appropriate? Yes; Fluid consistency: Thin  Diet effective now       Question Answer Comment  Room service appropriate? Yes   Fluid consistency: Thin       06/25/21 1624            Subjective 06/26/21    Patient declined spanish interpretor at this time.  Pt reports no abdominal pain today and denies N/V.  Disposition Plan & Communication  Patient status: Observation  Admitted From: Home Disposition: Home Anticipated discharge date: 12/14-15 pending MRCP results  Family Communication: partner at bedside  Consults, Procedures, Significant Events  Consultants:  GI  Procedures/significant events:  None  Antimicrobials:  Anti-infectives (From admission, onward)    None       Objective   Vitals:   06/25/21 1731 06/25/21 2019 06/26/21 0019 06/26/21 0412  BP: 106/78 103/64 107/62 112/70  Pulse: 60 69 76 70  Resp: 18 20 20 16   Temp: 98 F (36.7 C) 98.2 F (36.8 C) 98.1 F (36.7 C) 98 F (36.7 C)  TempSrc: Oral Oral Oral Oral  SpO2: 97% 99% 100% 100%   No intake or output data in the 24 hours ending 06/26/21 0712 There were no vitals filed for this visit.  Patient BMI: There is no height or weight on file to calculate BMI.   Physical Exam:  General: awake, alert, NAD HEENT: atraumatic, clear conjunctiva, anicteric sclera, MMM, hearing grossly normal Respiratory: normal respiratory effort. Cardiovascular: normal S1/S2, RRR, no JVD, murmurs, quick capillary refill  Gastrointestinal: soft, NT, ND Nervous: A&O x3. no gross focal neurologic deficits, normal speech Extremities: moves all equally, no edema, normal tone Skin: dry, intact, normal temperature, normal color. No rashes, lesions or  ulcers on exposed skin Psychiatry: normal mood, congruent affect  Labs   I have personally reviewed following labs and imaging studies Admission on 06/25/2021  Component Date Value Ref Range Status   Sodium 06/25/2021 134 (L)  135 - 145 mmol/L Final   Potassium 06/25/2021 4.2  3.5 - 5.1 mmol/L Final   Chloride 06/25/2021 103  98 - 111 mmol/L Final   CO2 06/25/2021 25  22 - 32 mmol/L Final   Glucose, Bld 06/25/2021 118 (H)  70 -  99 mg/dL Final   BUN 16/07/930 13  6 - 20 mg/dL Final   Creatinine, Ser 06/25/2021 0.47  0.44 - 1.00 mg/dL Final   Calcium 35/57/3220 9.2  8.9 - 10.3 mg/dL Final   GFR, Estimated 06/25/2021 >60  >60 mL/min Final   Anion gap 06/25/2021 6  5 - 15 Final   WBC 06/25/2021 10.9 (H)  4.0 - 10.5 K/uL Final   RBC 06/25/2021 4.59  3.87 - 5.11 MIL/uL Final   Hemoglobin 06/25/2021 13.0  12.0 - 15.0 g/dL Final   HCT 25/42/7062 39.5  36.0 - 46.0 % Final   MCV 06/25/2021 86.1  80.0 - 100.0 fL Final   MCH 06/25/2021 28.3  26.0 - 34.0 pg Final   MCHC 06/25/2021 32.9  30.0 - 36.0 g/dL Final   RDW 37/62/8315 13.2  11.5 - 15.5 % Final   Platelets 06/25/2021 239  150 - 400 K/uL Final   nRBC 06/25/2021 0.0  0.0 - 0.2 % Final   Neutrophils Relative % 06/25/2021 70  % Final   Neutro Abs 06/25/2021 7.7  1.7 - 7.7 K/uL Final   Lymphocytes Relative 06/25/2021 19  % Final   Lymphs Abs 06/25/2021 2.1  0.7 - 4.0 K/uL Final   Monocytes Relative 06/25/2021 9  % Final   Monocytes Absolute 06/25/2021 1.0  0.1 - 1.0 K/uL Final   Eosinophils Relative 06/25/2021 1  % Final   Eosinophils Absolute 06/25/2021 0.1  0.0 - 0.5 K/uL Final   Basophils Relative 06/25/2021 0  % Final   Basophils Absolute 06/25/2021 0.0  0.0 - 0.1 K/uL Final   Immature Granulocytes 06/25/2021 1  % Final   Abs Immature Granulocytes 06/25/2021 0.07  0.00 - 0.07 K/uL Final   Lipase 06/25/2021 33  11 - 51 U/L Final   Total Protein 06/25/2021 7.8  6.5 - 8.1 g/dL Final   Albumin 17/61/6073 4.3  3.5 - 5.0 g/dL Final   AST 71/12/2692 489 (H)  15 - 41 U/L Final   ALT 06/25/2021 241 (H)  0 - 44 U/L Final   Alkaline Phosphatase 06/25/2021 92  38 - 126 U/L Final   Total Bilirubin 06/25/2021 0.7  0.3 - 1.2 mg/dL Final   Bilirubin, Direct 06/25/2021 0.3 (H)  0.0 - 0.2 mg/dL Final   Indirect Bilirubin 06/25/2021 0.4  0.3 - 0.9 mg/dL Final   Hepatitis B Surface Ag 06/25/2021 NON REACTIVE  NON REACTIVE Final   HCV Ab 06/25/2021 NON REACTIVE  NON REACTIVE  Final   Hep A IgM 06/25/2021 NON REACTIVE  NON REACTIVE Final   Hep B C IgM 06/25/2021 NON REACTIVE  NON REACTIVE Final   Prothrombin Time 06/25/2021 13.6  11.4 - 15.2 seconds Final   INR 06/25/2021 1.0  0.8 - 1.2 Final   Acetaminophen (Tylenol), Serum 06/25/2021 12  10 - 30 ug/mL Final   SARS Coronavirus 2 by RT PCR 06/25/2021 NEGATIVE  NEGATIVE Final   Influenza A by PCR 06/25/2021 NEGATIVE  NEGATIVE  Final   Influenza B by PCR 06/25/2021 NEGATIVE  NEGATIVE Final   Salicylate Lvl 06/25/2021 <7.0 (L)  7.0 - 30.0 mg/dL Final   Ferritin 96/10/5407 189  11 - 307 ng/mL Final   Iron 06/25/2021 62  28 - 170 ug/dL Final   TIBC 81/19/1478 364  250 - 450 ug/dL Final   Saturation Ratios 06/25/2021 17  10.4 - 31.8 % Final   UIBC 06/25/2021 302  ug/dL Final    Imaging Studies  US OB LESS THAN 14 WEEKS WITH OB TRANSVAGINAL  Result Date: 06/25/2021 CLINICAL DATA:  Vaginal bleeding EXAM: OBSTETRIC <14 WK [place] AND TRANSVAGINAL OB US TECHNIQUE: Both transabdominal and transvaginal ultrasound examinations were performed for complete evaluation of the gestation as well as the maternal uterus, adnexal regions, and pelvic cul-de-sac. Transvaginal technique was performed to assess early pregnancy. COMPARISON:  None. FINDINGS: Intrauterine gestational sac: Single Yolk sac:  Visualized. Embryo:  Not Visualized. MSD: 5.5 mm   5 w   2 d Subchorionic hemorrhage:  None visualized. Maternal uterus/adnexae: Unremarkable.  Trace free fluid. IMPRESSION: Intrauterine pregnancy with gestational sac and yolk sac identified. No embryo visualized at this time. Follow-up ultrasound recommended in 10 days. Trace free fluid. Electronically Signed   By: Guadlupe Spanish M.D.   On: 06/25/2021 07:27   US Abdomen Limited RUQ (LIVER/GB)  Result Date: 06/25/2021 CLINICAL DATA:  Right upper quadrant abdominal pain EXAM: ULTRASOUND ABDOMEN LIMITED RIGHT UPPER QUADRANT COMPARISON:  None. FINDINGS: Gallbladder: Echogenic, shadowing stone within  the gallbladder lumen measuring up to 1.8 cm in diameter. No pericholecystic fluid or wall thickening visualized. No sonographic Murphy sign noted by sonographer. Common bile duct: Diameter: 6.6 mm, upper limits of normal. Liver: No focal lesion identified. Diffusely increased hepatic parenchymal echogenicity. Portal vein is patent on color Doppler imaging with normal direction of blood flow towards the liver. Other: None. IMPRESSION: 1. Cholelithiasis without sonographic evidence of acute cholecystitis. 2. The echogenicity of the liver is increased. This is a nonspecific finding but is most commonly seen with fatty infiltration of the liver. There are no obvious focal liver lesions. Electronically Signed   By: Duanne Guess D.O.   On: 06/25/2021 14:43    Medications   Scheduled Meds:  docusate sodium  100 mg Oral BID   pantoprazole (PROTONIX) IV  40 mg Intravenous Daily   No recently discontinued medications to reconcile  LOS: 0 days   Time spent: >62min  Leeroy Bock, DO [facility] 06/26/2021, 7:12 AM   Available by Epic secure chat 7AM-7PM. If 7PM-7AM, please contact night-coverage Refer to [email or web address] to contact the [facility] Attending or Consulting provider for this pt

## 2021-06-27 ENCOUNTER — Other Ambulatory Visit: Payer: Self-pay | Admitting: Family Medicine

## 2021-06-27 ENCOUNTER — Telehealth: Payer: Self-pay

## 2021-06-27 DIAGNOSIS — O209 Hemorrhage in early pregnancy, unspecified: Secondary | ICD-10-CM | POA: Insufficient documentation

## 2021-06-27 DIAGNOSIS — Z3490 Encounter for supervision of normal pregnancy, unspecified, unspecified trimester: Secondary | ICD-10-CM

## 2021-06-27 DIAGNOSIS — Z3491 Encounter for supervision of normal pregnancy, unspecified, first trimester: Secondary | ICD-10-CM

## 2021-06-27 DIAGNOSIS — R7989 Other specified abnormal findings of blood chemistry: Secondary | ICD-10-CM

## 2021-06-27 LAB — COMPREHENSIVE METABOLIC PANEL
ALT: 449 U/L — ABNORMAL HIGH (ref 0–44)
AST: 158 U/L — ABNORMAL HIGH (ref 15–41)
Albumin: 3.4 g/dL — ABNORMAL LOW (ref 3.5–5.0)
Alkaline Phosphatase: 101 U/L (ref 38–126)
Anion gap: 5 (ref 5–15)
BUN: 7 mg/dL (ref 6–20)
CO2: 24 mmol/L (ref 22–32)
Calcium: 8.3 mg/dL — ABNORMAL LOW (ref 8.9–10.3)
Chloride: 106 mmol/L (ref 98–111)
Creatinine, Ser: 0.46 mg/dL (ref 0.44–1.00)
GFR, Estimated: >60 mL/min (ref >60)
Glucose, Bld: 100 mg/dL — ABNORMAL HIGH (ref 70–99)
Potassium: 3.9 mmol/L (ref 3.5–5.1)
Sodium: 135 mmol/L (ref 135–145)
Total Bilirubin: 0.6 mg/dL (ref 0.3–1.2)
Total Protein: 6.2 g/dL — ABNORMAL LOW (ref 6.5–8.1)

## 2021-06-27 LAB — CBC
HCT: 34.1 % — ABNORMAL LOW (ref 36.0–46.0)
Hemoglobin: 11.4 g/dL — ABNORMAL LOW (ref 12.0–15.0)
MCH: 28.5 pg (ref 26.0–34.0)
MCHC: 33.4 g/dL (ref 30.0–36.0)
MCV: 85.3 fL (ref 80.0–100.0)
Platelets: 223 10*3/uL (ref 150–400)
RBC: 4 MIL/uL (ref 3.87–5.11)
RDW: 13.1 % (ref 11.5–15.5)
WBC: 6.4 10*3/uL (ref 4.0–10.5)
nRBC: 0 % (ref 0.0–0.2)

## 2021-06-27 LAB — HCG, QUANTITATIVE, PREGNANCY: hCG, Beta Chain, Quant, S: 10374 m[IU]/mL — ABNORMAL HIGH (ref <5)

## 2021-06-27 LAB — CERULOPLASMIN: Ceruloplasmin: 22.5 mg/dL (ref 19.0–39.0)

## 2021-06-27 LAB — ANA: Anti Nuclear Antibody (ANA): NEGATIVE

## 2021-06-27 LAB — ANTI-SMOOTH MUSCLE ANTIBODY, IGG: F-Actin IgG: 24 Units — ABNORMAL HIGH (ref 0–19)

## 2021-06-27 LAB — MITOCHONDRIAL ANTIBODIES: Mitochondrial M2 Ab, IgG: <20 Units (ref 0.0–20.0)

## 2021-06-27 NOTE — Assessment & Plan Note (Signed)
Peaked to AST 608, ALT 736, bili 1.8 Improved today to AST 158, ALT 449, bili 0.6 on day of d/c No significant APAP or OTC medication use Negative acute hepatitis panel, negative HIV Negative ANA.  Ceruloplasmin wnl.  ASMA weakly positive - will defer to GI in follow up Negative antimitochondrial antibodies Pending hepatitis E IgM antibody Pending HSV  Appreciate GI assistance, as above, planning for repeat LFT's in about 1 week.  Suspected passed gallstone.  Will need surgery follow up after pregnancy.

## 2021-06-27 NOTE — Assessment & Plan Note (Addendum)
[place] with intrauterine pregnancy with gestational sac and yolk sac, no embryo visualized. Beta HCG increased from 6523-> 10374 Follow up has been arranged at [facility] Department for repeat ultrasound (and repeat LFT's)

## 2021-06-27 NOTE — TOC Progression Note (Signed)
Transition of Care [facility]) - Progression Note    [name] MRN: [id number] Date of Birth: 11-24-1990  [facility] CM/SW Contact  Caryn Section, RN Phone Number: 06/27/2021, 3:44 PM  Clinical Narrative:   Financial assistance started medicaid application.  RNCM provided healthcare resources in spanish through spanish interpreter.  Patient accepted resources and stated she would look them over.         Expected Discharge Plan and Services           Expected Discharge Date: 06/27/21                                     Social Determinants of Health (SDOH) Interventions    Readmission Risk Interventions No flowsheet data found.

## 2021-06-27 NOTE — Assessment & Plan Note (Signed)
As above.

## 2021-06-27 NOTE — Assessment & Plan Note (Signed)
No CP, SOB, or LE edema Unclear sig in pregnancy No further w/u at this time unless symptomatic

## 2021-06-27 NOTE — Telephone Encounter (Signed)
Denzil Hughes, clerk, notified RN client on phone. Counseled to notify client RN would call her back. Call to client (hospitalized at [facility]) who is asking if [place] scheduled yet (refer to 06/26/2021 phone encounter). [place] recommendation 06/25/2021 was to have follow-up exam in 10 days and client states told to call [facility] to get scheduled. RN requested client ask MD at hospital if they could order [place] and secure chat sent to E. Sciora CNM regarding above. Jossie Ng, RN

## 2021-06-27 NOTE — Progress Notes (Signed)
BHCG has increased by 59% and is not doubling. There is appropriate (>15% rise) and the current rise is reassuring overall.   Lab Results  Component Value Date   HCGBETAQNT 10,374 (H) 06/27/2021   HCGBETAQNT 6,523 (H) 06/25/2021   HCGBETAQNT 31 (H) 01/19/2021    [place] without embryo on 12/12. Needs repeat US in 10-14 days to establish viability. Ordered in [place], RN to help schedule

## 2021-06-27 NOTE — Discharge Summary (Addendum)
Physician Discharge Summary  Gabrielle Romero WGN:[id number] DOB: 02/21/1991 DOA: 06/25/2021  PCP: Department, [facility]  Admit date: 06/25/2021 Discharge date: 06/27/2021  Time spent: 40 minutes  Recommendations for Outpatient Follow-up:  Follow outpatient CBC/CMP Follow repeat LFT's in about 1 week Follow repeat OB ultrasound with health department in about 10 days  Follow with GI outpatient Establish with PCP outpatient Needs surgical follow up after pregnancy Follow pending labs (ASMA weakly positive - pending hep E IgM ab and HSV PCR)   Discharge Diagnoses:  Principal Problem:   Abdominal pain Active Problems:   Biliary colic   Normal IUP (intrauterine pregnancy) on prenatal ultrasound   Cholelithiasis   RUQ pain   Transaminitis   Elevated LFTs   Vaginal bleeding affecting early pregnancy   First trimester pregnancy   Positive D dimer   Discharge Condition: stable  Diet recommendation: heart healthy  There were no vitals filed for this visit.  History of present illness:  30 yo F with hx current intrauterine pregnancy with recent miscarriage in 12/2020 as well as biliary colic who presents with right upper quadrant pain and elevated liver enzymes.  She was initially seen on the day of admission in the ED for vaginal bleeding, which has now improved.  GI was consulted for her elevated liver enzymes and she had extensive workup including MRCP which has been reassuring to this point.  Plan at this time with resolution of pain is for outpatient follow up with GI.  She'll need repeat US with OB as an outpatient.    See below for additional details  Hospital Course:  * Abdominal pain Thought 2/2 symptomatic cholelithiasis/passed stone She's had episodes of what sound like biliary colic since May, 1-2x/month MRCP shows cholelithiasis without cholecystitis, normal caliber bile duct GI was c/s and recommending outpatient GI follow up, surgery follow up  after pregnancy for cholecystectomy, autoimmune serologies (pending) Her symptoms have essentially resolved today  RUQ pain As above  Cholelithiasis As above  Transaminitis Peaked to AST 608, ALT 736, bili 1.8 Improved today to AST 158, ALT 449, bili 0.6 on day of d/c No significant APAP or OTC medication use Negative acute hepatitis panel, negative HIV Negative ANA.  Ceruloplasmin wnl.  ASMA weakly positive - will defer to GI in follow up Negative antimitochondrial antibodies Pending hepatitis E IgM antibody Pending HSV  Appreciate GI assistance, as above, planning for repeat LFT's in about 1 week.  Suspected passed gallstone.  Will need surgery follow up after pregnancy.  First trimester pregnancy [redacted]w[redacted]d by [place] Needs follow up US as noted below  Vaginal bleeding affecting early pregnancy [place] with intrauterine pregnancy with gestational sac and yolk sac, no embryo visualized. Beta HCG increased from 6523-> 10374 Follow up has been arranged at [facility] Department for repeat ultrasound (and repeat LFT's)  Positive D dimer No CP, SOB, or LE edema Unclear sig in pregnancy No further w/u at this time unless symptomatic  Procedures:  none  Consultations: GI  Discharge Exam: Vitals:   06/27/21 0907 06/27/21 1217  BP: 115/69 112/60  Pulse: 71 69  Resp: 16 18  Temp: 98.5 F (36.9 C) 98.8 F (37.1 C)  SpO2: 100% 100%   Feels better today  General: No acute distress. Cardiovascular: RRR Lungs: unlabored Abdomen: Soft, nontender, nondistended Neurological: Alert and oriented 3. Moves all extremities 4 . Cranial nerves II through XII grossly intact. Skin: Warm and dry. No rashes or lesions. Extremities: No clubbing or cyanosis. No edema.  Discharge Instructions   Discharge Instructions     Call MD for:  difficulty breathing, headache or visual disturbances   Complete by: As directed    Call MD for:  extreme fatigue   Complete by: As directed    Call MD for:   hives   Complete by: As directed    Call MD for:  persistant dizziness or light-headedness   Complete by: As directed    Call MD for:  persistant nausea and vomiting   Complete by: As directed    Call MD for:  redness, tenderness, or signs of infection (pain, swelling, redness, odor or green/yellow discharge around incision site)   Complete by: As directed    Call MD for:  severe uncontrolled pain   Complete by: As directed    Call MD for:  temperature >100.4   Complete by: As directed    Diet - low sodium heart healthy   Complete by: As directed    Discharge instructions   Complete by: As directed    You were seen for right upper quadrant pain and elevated liver enzymes.    Your liver enzymes are improving.  We think that you may have passed Kirbi Farrugia gallstone, leading to pain and elevated liver enzymes.    You should follow up with ob gyn as an outpatient.  [facility]. Francesca Oman clinic at the [facility] will call you to arrange Darean Rote follow up appointment in about Cedrica Brune week.  You'll get Veleria Barnhardt repeat ultrasound and repeat liver labs as well.  Avoid tylenol and over the counter medications, unless you've discussed these with your PCP.    You should take over the counter prenatal vitamins.  You should follow up with gastroenterology as an outpatient.  You have pending labs (ANA, anti smooth muscle ab, mitochondrial AB, and HSV) which will need to be followed up.  Because of your symptomatic gallstones, you'll need to eventually follow up with general surgery and surgery will need to be considered (hopefully after your pregnancy).  Please discuss surgery follow up with gastroenterology and or your PCP.  Return for new, recurrent, or worsening symptoms.  Please ask your PCP to request records from this hospitalization so they know what was done and what the next steps will be.   Increase activity slowly   Complete by: As directed       Allergies as of 06/27/2021   No Known Allergies       Medication List     STOP taking these medications    misoprostol 200 MCG tablet Commonly known as: CYTOTEC       No Known Allergies  Follow-up Information     Department, [facility] Follow up.   Contact information: [address] [place] [address] [phone]                  The results of significant diagnostics from this hospitalization (including imaging, microbiology, ancillary and laboratory) are listed below for reference.    Significant Diagnostic Studies: MR ABDOMEN MRCP WO CONTRAST  Result Date: 06/26/2021 CLINICAL DATA:  Common bile duct dilation. EXAM: MRI ABDOMEN WITHOUT CONTRAST  (INCLUDING MRCP) TECHNIQUE: Multiplanar multisequence MR imaging of the abdomen was performed. Heavily T2-weighted images of the biliary and pancreatic ducts were obtained, and three-dimensional MRCP images were rendered by post processing. COMPARISON:  None. FINDINGS: Lower chest: No acute findings. Hepatobiliary: No mass or other parenchymal abnormality identified. No evidence of hepatic steatosis. Large stone noted  in the gallbladder. No gallbladder wall thickening or inflammation. Common bile duct measures up to 5 mm. Pancreas: No mass, inflammatory changes, or other parenchymal abnormality identified. Spleen:  Within normal limits in size and appearance. Adrenals/Urinary Tract: No masses identified. No evidence of hydronephrosis. Stomach/Bowel: Visualized portions within the abdomen are unremarkable. Vascular/Lymphatic: No pathologically enlarged lymph nodes identified. No abdominal aortic aneurysm demonstrated. Other:  None. Musculoskeletal: No suspicious bone lesions identified. IMPRESSION: 1. Cholelithiasis with no evidence of acute cholecystitis. 2. Normal caliber bile duct, measuring up to 5 mm. No evidence choledocholithiasis. Electronically Signed   By: Allegra Lai M.D.   On: 06/26/2021 14:37   MR 3D Recon At Scanner  Result Date:  06/26/2021 CLINICAL DATA:  Common bile duct dilation. EXAM: MRI ABDOMEN WITHOUT CONTRAST  (INCLUDING MRCP) TECHNIQUE: Multiplanar multisequence MR imaging of the abdomen was performed. Heavily T2-weighted images of the biliary and pancreatic ducts were obtained, and three-dimensional MRCP images were rendered by post processing. COMPARISON:  None. FINDINGS: Lower chest: No acute findings. Hepatobiliary: No mass or other parenchymal abnormality identified. No evidence of hepatic steatosis. Large stone noted in the gallbladder. No gallbladder wall thickening or inflammation. Common bile duct measures up to 5 mm. Pancreas: No mass, inflammatory changes, or other parenchymal abnormality identified. Spleen:  Within normal limits in size and appearance. Adrenals/Urinary Tract: No masses identified. No evidence of hydronephrosis. Stomach/Bowel: Visualized portions within the abdomen are unremarkable. Vascular/Lymphatic: No pathologically enlarged lymph nodes identified. No abdominal aortic aneurysm demonstrated. Other:  None. Musculoskeletal: No suspicious bone lesions identified. IMPRESSION: 1. Cholelithiasis with no evidence of acute cholecystitis. 2. Normal caliber bile duct, measuring up to 5 mm. No evidence choledocholithiasis. Electronically Signed   By: Allegra Lai M.D.   On: 06/26/2021 14:37   US OB LESS THAN 14 WEEKS WITH OB TRANSVAGINAL  Result Date: 06/25/2021 CLINICAL DATA:  Vaginal bleeding EXAM: OBSTETRIC <14 WK [place] AND TRANSVAGINAL OB US TECHNIQUE: Both transabdominal and transvaginal ultrasound examinations were performed for complete evaluation of the gestation as well as the maternal uterus, adnexal regions, and pelvic cul-de-sac. Transvaginal technique was performed to assess early pregnancy. COMPARISON:  None. FINDINGS: Intrauterine gestational sac: Single Yolk sac:  Visualized. Embryo:  Not Visualized. MSD: 5.5 mm   5 w   2 d Subchorionic hemorrhage:  None visualized. Maternal uterus/adnexae:  Unremarkable.  Trace free fluid. IMPRESSION: Intrauterine pregnancy with gestational sac and yolk sac identified. No embryo visualized at this time. Follow-up ultrasound recommended in 10 days. Trace free fluid. Electronically Signed   By: Guadlupe Spanish M.D.   On: 06/25/2021 07:27   US Abdomen Limited RUQ (LIVER/GB)  Result Date: 06/25/2021 CLINICAL DATA:  Right upper quadrant abdominal pain EXAM: ULTRASOUND ABDOMEN LIMITED RIGHT UPPER QUADRANT COMPARISON:  None. FINDINGS: Gallbladder: Echogenic, shadowing stone within the gallbladder lumen measuring up to 1.8 cm in diameter. No pericholecystic fluid or wall thickening visualized. No sonographic Murphy sign noted by sonographer. Common bile duct: Diameter: 6.6 mm, upper limits of normal. Liver: No focal lesion identified. Diffusely increased hepatic parenchymal echogenicity. Portal vein is patent on color Doppler imaging with normal direction of blood flow towards the liver. Other: None. IMPRESSION: 1. Cholelithiasis without sonographic evidence of acute cholecystitis. 2. The echogenicity of the liver is increased. This is Samhita Kretsch nonspecific finding but is most commonly seen with fatty infiltration of the liver. There are no obvious focal liver lesions. Electronically Signed   By: Duanne Guess D.O.   On: 06/25/2021 14:43    Microbiology:  Recent Results (from the past 240 hour(s))  Resp Panel by RT-PCR (Flu Rawson Minix&B, Covid) Nasopharyngeal Swab     Status: None   Collection Time: 06/25/21  5:51 PM   Specimen: Nasopharyngeal Swab; Nasopharyngeal(NP) swabs in vial transport medium  Result Value Ref Range Status   SARS Coronavirus 2 by RT PCR NEGATIVE NEGATIVE Final    Comment: (NOTE) SARS-CoV-2 target nucleic acids are NOT DETECTED.  The SARS-CoV-2 RNA is generally detectable in upper respiratory specimens during the acute phase of infection. The lowest concentration of SARS-CoV-2 viral copies this assay can detect is 138 copies/mL. Faustine Tates negative result does  not preclude SARS-Cov-2 infection and should not be used as the sole basis for treatment or other patient management decisions. Kalandra Masters negative result may occur with  improper specimen collection/handling, submission of specimen other than nasopharyngeal swab, presence of viral mutation(s) within the areas targeted by this assay, and inadequate number of viral copies(<138 copies/mL). Elasha Tess negative result must be combined with clinical observations, patient history, and epidemiological information. The expected result is Negative.  Fact Sheet for Patients:  BloggerCourse.com  Fact Sheet for Healthcare Providers:  SeriousBroker.it  This test is no t yet approved or cleared by the [place] FDA and  has been authorized for detection and/or diagnosis of SARS-CoV-2 by FDA under an Emergency Use Authorization (EUA). This EUA will remain  in effect (meaning this test can be used) for the duration of the COVID-19 declaration under Section 564(b)(1) of the Act, 21 U.S.C.section 360bbb-3(b)(1), unless the authorization is terminated  or revoked sooner.       Influenza Kauri Garson by PCR NEGATIVE NEGATIVE Final   Influenza B by PCR NEGATIVE NEGATIVE Final    Comment: (NOTE) The Xpert Xpress SARS-CoV-2/FLU/RSV plus assay is intended as an aid in the diagnosis of influenza from Nasopharyngeal swab specimens and should not be used as Ibrohim Simmers sole basis for treatment. Nasal washings and aspirates are unacceptable for Xpert Xpress SARS-CoV-2/FLU/RSV testing.  Fact Sheet for Patients: BloggerCourse.com  Fact Sheet for Healthcare Providers: SeriousBroker.it  This test is not yet approved or cleared by the [place] FDA and has been authorized for detection and/or diagnosis of SARS-CoV-2 by FDA under an Emergency Use Authorization (EUA). This EUA will remain in effect (meaning this test can be used) for the  duration of the COVID-19 declaration under Section 564(b)(1) of the Act, 21 U.S.C. section 360bbb-3(b)(1), unless the authorization is terminated or revoked.  Performed at [facility], [address] [place], [place] [address]      Labs: Basic Metabolic Panel: Recent Labs  Lab 06/25/21 0603 06/25/21 1351 06/26/21 0647 06/27/21 0501  NA 135 134* 137 135  K 4.0 4.2 4.5 3.9  CL 107 103 109 106  CO2 21* GLUCOSE 119* 118* 101* 100*  BUN CREATININE 0.39* 0.47 0.48 0.46  CALCIUM 9.0 9.2 8.2* 8.3*   Liver Function Tests: Recent Labs  Lab 06/25/21 1351 06/26/21 0647 06/27/21 0501  AST 489* 608* 158*  ALT 241* 736* 449*  ALKPHOS 92 116 101  BILITOT 0.7 1.8* 0.6  PROT 7.8 6.3* 6.2*  ALBUMIN 4.3 3.3* 3.4*   Recent Labs  Lab 06/25/21 1351  LIPASE 33   No results for input(s): AMMONIA in the last 168 hours. CBC: Recent Labs  Lab 06/25/21 0603 06/25/21 1351 06/26/21 0647 06/27/21 0501  WBC 9.1 10.9* 4.5 6.4  NEUTROABS  --  7.7  --   --  HGB 13.3 13.0 11.8* 11.4*  HCT 39.9 39.5 36.0 34.1*  MCV 86.2 86.1 85.5 85.3  PLT 255 239 230 223   Cardiac Enzymes: No results for input(s): CKTOTAL, CKMB, CKMBINDEX, TROPONINI in the last 168 hours. BNP: BNP (last 3 results) No results for input(s): BNP in the last 8760 hours.  ProBNP (last 3 results) No results for input(s): PROBNP in the last 8760 hours.  CBG: No results for input(s): GLUCAP in the last 168 hours.     Signed:  Lacretia Nicks MD.  [facility] 06/27/2021, 5:39 PM

## 2021-06-27 NOTE — Hospital Course (Signed)
30 yo F with hx current intrauterine pregnancy with recent miscarriage in 12/2020 as well as biliary colic who presents with right upper quadrant pain and elevated liver enzymes.  She was initially seen on the day of admission in the ED for vaginal bleeding, which has now improved.  GI was consulted for her elevated liver enzymes and she had extensive workup including MRCP which has been reassuring to this point.  Plan at this time with resolution of pain is for outpatient follow up with GI.  She'll need repeat US with OB as an outpatient.    See below for additional details

## 2021-06-27 NOTE — Assessment & Plan Note (Addendum)
[redacted]w[redacted]d by [place] Needs follow up US as noted below

## 2021-06-27 NOTE — Progress Notes (Addendum)
GI Inpatient Follow-up Note  Patient Identification: Gabrielle Romero is a 30 y.o. female admitted on 06/25/21 for RUQ pain, nausea.   Subjective: Feeling much better this morning. Abd pain seemed to resolve throughout the day yesterday - no abd pain or nausea overnight or this morning. Tolerated normal diet for lunch and dinner. No BM overnight. Husband at bedside. All hx obtained via [place].   Scheduled Inpatient Medications:   pantoprazole (PROTONIX) IV  40 mg Intravenous Daily    Continuous Inpatient Infusions:    sodium chloride 75 mL/hr at 06/25/21 1841    PRN Inpatient Medications:  acetaminophen, metoCLOPramide (REGLAN) injection, oxyCODONE-acetaminophen  Review of Systems: Constitutional: Weight is stable.  Eyes: No changes in vision. ENT: No oral lesions, sore throat.  GI: see HPI.  Heme/Lymph: No easy bruising.  CV: No chest pain.  GU: No hematuria.  Integumentary: No rashes.  Neuro: No headaches.  Psych: No depression/anxiety.  Endocrine: No heat/cold intolerance.  Allergic/Immunologic: No urticaria.  Resp: No cough, SOB.  Musculoskeletal: No joint swelling.    Physical Examination: BP (!) 103/59 (BP Location: Left Arm)    Pulse 72    Temp 98 F (36.7 C) (Oral)    Resp 18    LMP 05/12/2021 (Exact Date)    SpO2 100%  Gen: NAD, alert and oriented x 4. Appears comfortable.  HEENT: PEERLA, EOMI, Neck: supple, no JVD or thyromegaly Chest: CTA bilaterally, no wheezes, crackles, or other adventitious sounds CV: RRR, no m/g/c/r Abd: soft, non tender on abd exam. ND, +BS in all four quadrants; no HSM, guarding, ridigity, or rebound tenderness Ext: no edema, well perfused with 2+ pulses, Skin: no rash or lesions noted Lymph: no LAD  Data: Lab Results  Component Value Date   WBC 6.4 06/27/2021   HGB 11.4 (L) 06/27/2021   HCT 34.1 (L) 06/27/2021   MCV 85.3 06/27/2021   PLT 223 06/27/2021   Recent Labs  Lab 06/25/21 1351 06/26/21 0647  06/27/21 0501  HGB 13.0 11.8* 11.4*   Lab Results  Component Value Date   NA 135 06/27/2021   K 3.9 06/27/2021   CL 106 06/27/2021   CO2 24 06/27/2021   BUN 7 06/27/2021   CREATININE 0.46 06/27/2021   Lab Results  Component Value Date   ALT 449 (H) 06/27/2021   AST 158 (H) 06/27/2021   ALKPHOS 101 06/27/2021   BILITOT 0.6 06/27/2021   Recent Labs  Lab 06/26/21 0926 06/26/21 1443  APTT  --  29  INR 1.1  --     MRCP - 06/26/21-IMPRESSION: 1. Cholelithiasis with no evidence of acute cholecystitis. 2. Normal caliber bile duct, measuring up to 5 mm. No evidence choledocholithiasis.    Assessment/Plan: Ms. Gabrielle Romero is a 30 y.o. female   Acute hepatitis/Abnormal LFTS- LFTS have trended down significant over past 24 hrs (AST 608-->158) and ALT (736-->449). Bili now normal down from minimal elevation yesterday. CBC stable. INR normal. Imaging yesterday with MRCP as above. Denies alcohol use, recent ABX, herbal/supplement use, etc. Tylenol level 12 from Tylenol dosing in ED. Afebrile and VSS overnight.   Suspect her LFT elevation and nausea/RUQ pain due to passed store. Fortunately LFTS trending down and MRCP without evidence of choledocholithiasis. CBD diameter normal. Does have cholelithiasis w/ intermittent RUQ and nausea 1-2x/month   Recommendations:  Follow up on pending labs (ANA, anti-smooth muscle, mitochondrial AB, HSV) Close OB and GI follow up as outpatient Consider CCY after delivery given cholelithiasis with  symptoms 1-2x/month Okay to stop protonix and d/c home on Tums PRN given pregnancy. Has infrequent GERD symptoms and no chronic dyspepsia like symptoms.   Please call with questions or concerns. Case discussed w/ Dr. Timothy Lasso.   Thank you for allowing [place] to participate in this patient's care. Please don't hesitate to call if any questions or concerns arise.   Tawni Pummel, PA-C [facility] GI    Attending Physician Attestation:  I have  obtained an interval history from the patient, reviewed the chart and performed a physical examination. The Advanced Practitioner's note, clinical impression and recommendations have been reviewed and I agree with the assessment and plan as outlined above with the following further recommendations:  1- Arrange for liver function testing in 1 week to ensure continued decline. Suspect patient had stone in the CBD resulting in symptoms and lab changes which has now passed resulting in drastic lab improvement and sx improvement 2- f/u AI serologies 3- Arranging for GI follow up in office 4- Recommend patient be seen by surgery for cholecystectomy after pregnancy 5- can d/c protonix/ppi 6- Discussed case with hospitalist   Thank you for allowing [place] to be involved in this patient's care.  Please call me or reach out via Epic Secure chat if you have any questions.   Enis Slipper, DO [facility] Gastroenterology  Office: [phone]

## 2021-06-27 NOTE — Care Management (Signed)
°  Transition of Care [facility]) Screening Note   [name] Date of Birth: 01/07/1991   Transition of Care [facility]) CM/SW Contact:    Caryn Section, RN Phone Number: 06/27/2021, 2:10 PM    Transition of Care Department [facility]) has reviewed patient and no TOC needs have been identified at this time. We will continue to monitor patient advancement through interdisciplinary progression rounds. If new patient transition needs arise, please place a TOC consult.    Patient has no insurance, Artist to see patient for Medicaid application.

## 2021-06-27 NOTE — Assessment & Plan Note (Addendum)
Thought 2/2 symptomatic cholelithiasis/passed stone She's had episodes of what sound like biliary colic since May, 1-2x/month MRCP shows cholelithiasis without cholecystitis, normal caliber bile duct GI was c/s and recommending outpatient GI follow up, surgery follow up after pregnancy for cholecystectomy, autoimmune serologies (pending) Her symptoms have essentially resolved today

## 2021-06-28 ENCOUNTER — Telehealth: Payer: Self-pay

## 2021-06-28 LAB — HSV DNA BY PCR (REFERENCE LAB)
HSV 1 DNA: NEGATIVE
HSV 2 DNA: NEGATIVE

## 2021-06-28 NOTE — Telephone Encounter (Signed)
Call to client with following information and Salli Real interpreted.      1 ) [facility] [place] appt 07/05/2021 1345 with arrival time of 1330. Client to drink 32 oz of water and not void as [place] will be done with a full bladder.  2)  [facility] appt same day following [place]. Appt scheduled for 1600 and to be worked in that day ASAP.  3)  Per Dr. Alvester Morin, LFTs at appt that day.

## 2021-07-05 ENCOUNTER — Other Ambulatory Visit: Payer: Self-pay

## 2021-07-05 ENCOUNTER — Ambulatory Visit: Payer: Self-pay | Admitting: Family Medicine

## 2021-07-05 ENCOUNTER — Encounter: Payer: Self-pay | Admitting: Family Medicine

## 2021-07-05 ENCOUNTER — Ambulatory Visit
Admission: RE | Admit: 2021-07-05 | Discharge: 2021-07-05 | Disposition: A | Discharge status: Home / Self Care | Payer: Self-pay | Source: Ambulatory Visit | Attending: Family Medicine | Admitting: Family Medicine

## 2021-07-05 VITALS — BP 100/64 | HR 66 | Temp 97.0°F | Ht 64.0 in | Wt 171.0 lb

## 2021-07-05 DIAGNOSIS — K8050 Calculus of bile duct without cholangitis or cholecystitis without obstruction: Secondary | ICD-10-CM

## 2021-07-05 DIAGNOSIS — Z3491 Encounter for supervision of normal pregnancy, unspecified, first trimester: Secondary | ICD-10-CM | POA: Diagnosis not present

## 2021-07-05 DIAGNOSIS — Z3490 Encounter for supervision of normal pregnancy, unspecified, unspecified trimester: Secondary | ICD-10-CM

## 2021-07-05 NOTE — Progress Notes (Signed)
Recent [facility] admission for transaminitis, biliary colic and abd pain. 06/25/2021 [place] with gestational sac and yolk sac. No embryo visualized (beta hcg results in Epic). Recommendation was for repeat US in 10 days. [place] ordered by Dr. Alvester Morin. Appt kept early pm today and [place] report not yet finalized. Per client, they saw a baby with a heart beat. Per order Dr. Alvester Morin, client was to have appt today following [place] with LFTs. Jossie Ng, RN

## 2021-07-06 LAB — HEPATIC FUNCTION PANEL
ALT: 63 IU/L — ABNORMAL HIGH (ref 0–32)
AST: 16 IU/L (ref 0–40)
Albumin: 4.3 g/dL (ref 3.8–4.8)
Alkaline Phosphatase: 88 IU/L (ref 44–121)
Bilirubin Total: <0.2 mg/dL (ref 0.0–1.2)
Bilirubin, Direct: <0.1 mg/dL (ref 0.00–0.40)
Total Protein: 7.1 g/dL (ref 6.0–8.5)

## 2021-07-11 ENCOUNTER — Telehealth: Payer: Self-pay | Admitting: Family Medicine

## 2021-07-11 NOTE — Telephone Encounter (Signed)
Call to patient to discuss results of [place].   Pt informed of viable pregnancy of [redacted]w[redacted]d was  found on [place] and to schedule Initial OB.  Pt is currently [redacted]w[redacted]d and is recommended to call for appointment around 10 week mark.   Pt instructed to seek help at ER if she starts to have bright red bleeding that is saturating a pad and/or severe abdominal cramping.    Pt reports some spotting and intermittent cramping.  Informed patient this is normal in early pregnancy.    Discussed abnormal bleeding and pain.    Pt verbalized understanding.   [facility] agency interpreter  used for Spanish interpretation.      Wendi Snipes, FNP

## 2021-07-11 NOTE — Progress Notes (Signed)
Pt in clinic for follow up after repeat of [place].   At this time [place] results are not available.  Patient informed that we will contact her with next steps when [place] results are in.     Pt. Verbalizes understanding.     M. [place] used for Spanish interpretation.     Wendi Snipes, FNP

## 2021-07-13 LAB — MISC LABCORP TEST (SEND OUT)
LabCorp test name: 2010156
Labcorp test code: 9985

## 2021-07-17 ENCOUNTER — Telehealth: Payer: Self-pay | Admitting: Family Medicine

## 2021-07-17 ENCOUNTER — Telehealth: Payer: Self-pay

## 2021-07-17 NOTE — Telephone Encounter (Signed)
Pt is returning a call  

## 2021-07-17 NOTE — Telephone Encounter (Signed)
Per result note from Dr. Alvester Morin following review of 07/05/2021 [place] report, client needs [facility] IP appt ~ 07/26/2021. Call to client and number not available. Call to emergency contact (significant other) who states he is at work, but will get message to client to call for appt. Number to call provided. Roddie Mc Yemen interpreted during call. Jossie Ng, RN

## 2021-07-17 NOTE — Telephone Encounter (Signed)
Please refer to phone encounter opened 07/17/2021 am. Jossie Ng, RN

## 2021-07-17 NOTE — Telephone Encounter (Signed)
Return call to client and [facility] IP appt scheduled for 07/27/2020 with arrival time of 0800. Rowland Lathe interpreted during call. Rich Number, RN

## 2021-07-27 ENCOUNTER — Encounter: Payer: Self-pay | Admitting: Emergency Medicine

## 2021-07-27 DIAGNOSIS — R1030 Lower abdominal pain, unspecified: Secondary | ICD-10-CM | POA: Insufficient documentation

## 2021-07-27 DIAGNOSIS — R8271 Bacteriuria: Secondary | ICD-10-CM | POA: Diagnosis not present

## 2021-07-27 DIAGNOSIS — Z3A11 11 weeks gestation of pregnancy: Secondary | ICD-10-CM | POA: Insufficient documentation

## 2021-07-27 DIAGNOSIS — O26891 Other specified pregnancy related conditions, first trimester: Secondary | ICD-10-CM | POA: Diagnosis present

## 2021-07-27 LAB — COMPREHENSIVE METABOLIC PANEL
ALT: 19 U/L (ref 0–44)
AST: 20 U/L (ref 15–41)
Albumin: 3.7 g/dL (ref 3.5–5.0)
Alkaline Phosphatase: 60 U/L (ref 38–126)
Anion gap: 8 (ref 5–15)
BUN: 8 mg/dL (ref 6–20)
CO2: 24 mmol/L (ref 22–32)
Calcium: 9.7 mg/dL (ref 8.9–10.3)
Chloride: 104 mmol/L (ref 98–111)
Creatinine, Ser: 0.5 mg/dL (ref 0.44–1.00)
GFR, Estimated: >60 mL/min (ref >60)
Glucose, Bld: 123 mg/dL — ABNORMAL HIGH (ref 70–99)
Potassium: 3.7 mmol/L (ref 3.5–5.1)
Sodium: 136 mmol/L (ref 135–145)
Total Bilirubin: 0.3 mg/dL (ref 0.3–1.2)
Total Protein: 7.1 g/dL (ref 6.5–8.1)

## 2021-07-27 LAB — CBC WITH DIFFERENTIAL/PLATELET
Abs Immature Granulocytes: 0.07 10*3/uL (ref 0.00–0.07)
Basophils Absolute: 0 10*3/uL (ref 0.0–0.1)
Basophils Relative: 0 %
Eosinophils Absolute: 0.2 10*3/uL (ref 0.0–0.5)
Eosinophils Relative: 2 %
HCT: 37.6 % (ref 36.0–46.0)
Hemoglobin: 12.5 g/dL (ref 12.0–15.0)
Immature Granulocytes: 1 %
Lymphocytes Relative: 31 %
Lymphs Abs: 3.1 10*3/uL (ref 0.7–4.0)
MCH: 28.3 pg (ref 26.0–34.0)
MCHC: 33.2 g/dL (ref 30.0–36.0)
MCV: 85.1 fL (ref 80.0–100.0)
Monocytes Absolute: 0.7 10*3/uL (ref 0.1–1.0)
Monocytes Relative: 7 %
Neutro Abs: 6.1 10*3/uL (ref 1.7–7.7)
Neutrophils Relative %: 59 %
Platelets: 241 10*3/uL (ref 150–400)
RBC: 4.42 MIL/uL (ref 3.87–5.11)
RDW: 12.8 % (ref 11.5–15.5)
WBC: 10.2 10*3/uL (ref 4.0–10.5)
nRBC: 0 % (ref 0.0–0.2)

## 2021-07-27 LAB — URINALYSIS, ROUTINE W REFLEX MICROSCOPIC
Bilirubin Urine: NEGATIVE
Glucose, UA: NEGATIVE mg/dL
Ketones, ur: NEGATIVE mg/dL
Leukocytes,Ua: NEGATIVE
Nitrite: NEGATIVE
Protein, ur: NEGATIVE mg/dL
Specific Gravity, Urine: 1.01 (ref 1.005–1.030)
pH: 5.5 (ref 5.0–8.0)

## 2021-07-27 LAB — POC URINE PREG, ED: Preg Test, Ur: POSITIVE — AB

## 2021-07-27 LAB — URINALYSIS, MICROSCOPIC (REFLEX)

## 2021-07-27 NOTE — ED Triage Notes (Signed)
Pt presents via POV with complaints of lower abdominal pain for the last several days- she states she is [redacted] weeks pregnant.  She denies any prenatal care- she was supposed to be seen today but her appointment was canceled by the office. Denies CP or SOB.   Triage completed using video interpreter Vladimir Crofts [phone])

## 2021-07-28 ENCOUNTER — Emergency Department: Payer: Medicaid Other

## 2021-07-28 ENCOUNTER — Emergency Department
Admission: EM | Admit: 2021-07-28 | Discharge: 2021-07-28 | Disposition: A | Discharge status: Home / Self Care | Payer: Medicaid Other | Attending: Emergency Medicine | Admitting: Emergency Medicine

## 2021-07-28 DIAGNOSIS — R8271 Bacteriuria: Secondary | ICD-10-CM

## 2021-07-28 DIAGNOSIS — Z3491 Encounter for supervision of normal pregnancy, unspecified, first trimester: Secondary | ICD-10-CM

## 2021-07-28 DIAGNOSIS — R1030 Lower abdominal pain, unspecified: Secondary | ICD-10-CM

## 2021-07-28 LAB — HCG, QUANTITATIVE, PREGNANCY: hCG, Beta Chain, Quant, S: 178782 m[IU]/mL — ABNORMAL HIGH (ref <5)

## 2021-07-28 LAB — LIPASE, BLOOD: Lipase: 32 U/L (ref 11–51)

## 2021-07-28 MED ORDER — ACETAMINOPHEN 500 MG PO TABS
1000.0000 mg | ORAL_TABLET | Freq: Once | ORAL | Status: AC
  Administered 2021-07-28: 1000 mg via ORAL
  Filled 2021-07-28: qty 2

## 2021-07-28 MED ORDER — DOXYLAMINE-PYRIDOXINE 10-10 MG PO TBEC
1.0000 | DELAYED_RELEASE_TABLET | Freq: Two times a day (BID) | ORAL | 0 refills | Status: AC | PRN
Start: 2021-07-28 — End: 2021-08-02

## 2021-07-28 MED ORDER — CEPHALEXIN 500 MG PO CAPS: 500.0000 mg | ORAL_CAPSULE | Freq: Four times a day (QID) | ORAL | 0 refills | Status: AC

## 2021-07-28 NOTE — ED Provider Notes (Signed)
[facility] Provider Note    Event Date/Time   First MD Initiated Contact with Patient 07/28/21 0240     (approximate)   History   Abdominal Pain   HPI  Gabrielle Romero is a 31 y.o. female G5 approximate [redacted] weeks pregnant by last LMP as well as a history of elevated LFTs and recent admission in December of last year with concerns for a gallstone who presents for assessment of some acute right lower quadrant abdominal pain during 2 or 3 days ago associate with some nausea that she feels is gotten worse.  She denies any left-sided abdominal pain, upper abdominal pain, vomiting, diarrhea, constipation, urinary symptoms, vaginal bleeding or discharge, back pain, headache or earache, sore throat, cough, fevers or any other clear associated sick symptoms.  She has not yet established OB care for this pregnancy.      Physical Exam  Triage Vital Signs: ED Triage Vitals [07/27/21 2027]  Enc Vitals Group     BP (!) 142/97     Pulse Rate 79     Resp 18     Temp 98.1 F (36.7 C)     Temp src      SpO2 100 %     Weight 170 lb (77.1 kg)     Height 5\' 4"  (1.626 m)     Head Circumference      Peak Flow      Pain Score 5     Pain Loc      Pain Edu?      Excl. in GC?     Most recent vital signs: Vitals:   07/28/21 0519 07/28/21 0520  BP:  127/64  Pulse:  66  Resp:  16  Temp:    SpO2: 99% 99%    General: Awake, no distress.  CV:  Good peripheral perfusion.  Resp:  Normal effort.  Abd:  No distention.  Tender in the right lower quadrant.  No CVA tenderness. Other:    ED Results / Procedures / Treatments  Labs (all labs ordered are listed, but only abnormal results are displayed) Labs Reviewed  URINALYSIS, ROUTINE W REFLEX MICROSCOPIC - Abnormal; Notable for the following components:      Result Value   Hgb urine dipstick TRACE (*)    All other components within normal limits  COMPREHENSIVE METABOLIC PANEL - Abnormal; Notable for the  following components:   Glucose, Bld 123 (*)    All other components within normal limits  URINALYSIS, MICROSCOPIC (REFLEX) - Abnormal; Notable for the following components:   Bacteria, UA RARE (*)    All other components within normal limits  HCG, QUANTITATIVE, PREGNANCY - Abnormal; Notable for the following components:   hCG, Beta Chain, Quant, 07/30/21 [place] (*)    All other components within normal limits  POC URINE PREG, ED - Abnormal; Notable for the following components:   Preg Test, Ur Positive (*)    All other components within normal limits  URINE CULTURE  CBC WITH DIFFERENTIAL/PLATELET  LIPASE, BLOOD     EKG   RADIOLOGY  OB pelvic ultrasound shows single IUP at approximately 10 weeks 1 day by size with normal blood flow to the bilateral ovaries.  No pelvic free fluid.  No subchorionic hemorrhage.  This was reviewed by myself.  I also reviewed radiology interpretation of studies and agree with her findings.   MR abdomen pelvis shows no evidence of cholecystitis, pancreatitis, diverticulitis, appendicitis, abscess or any other clear acute  abdominal or pelvic process.   PROCEDURES:  Critical Care performed: No  Procedures    MEDICATIONS ORDERED IN ED: Medications  acetaminophen (TYLENOL) tablet 1,000 mg (1,000 mg Oral Given 07/28/21 0520)     IMPRESSION / MDM / ASSESSMENT AND PLAN / ED COURSE  I reviewed the triage vital signs and the nursing notes.                              Differential diagnosis includes, but is not limited to, torsion, appendicitis, diverticulitis, kidney stone, cystitis, ovarian cyst, subchorionic hemorrhage and cystitis.  OB pelvic ultrasound shows single IUP at approximately 10 weeks 1 day by size with normal blood flow to the bilateral ovaries.  No pelvic free fluid.  No subchorionic hemorrhage.  This was reviewed by myself.  I also reviewed radiology interpretation of studies and agree with her findings.   MR abdomen pelvis shows no  evidence of cholecystitis, pancreatitis, diverticulitis, appendicitis, abscess or any other clear acute abdominal or pelvic process.  No right upper quadrant tenderness or findings on imaging or labs to suggest acute cholecystitis or other cholestatic process.  CMP shows no significant electrolyte or metabolic derangements.  CBC shows no leukocytosis or acute anemia.  UA is unremarkable aside from rare bacteria which I will treat for asymptomatic bacteria at this time with low suspicion for cystitis or pyelonephritis.  Lipase not consistent with pancreatitis.  hCG appropriate 170,000.  Reassessment after some Tylenol patient states he is feeling much better.  Given stable vitals with otherwise reassuring exam work-up I think patient stable for discharge with close outpatient OB follow-up.  She will follow-up with her obstetrician.  Rx written for Diclegis and Keflex.  Discharged in stable condition.      FINAL CLINICAL IMPRESSION(S) / ED DIAGNOSES   Final diagnoses:  First trimester pregnancy  Lower abdominal pain  Bacteriuria     Rx / DC Orders   ED Discharge Orders          Ordered    Doxylamine-Pyridoxine (DICLEGIS) 10-10 MG TBEC  2 times daily PRN        07/28/21 0635    cephALEXin (KEFLEX) 500 MG capsule  4 times daily        07/28/21 8338             Note:  This document was prepared using Dragon voice recognition software and may include unintentional dictation errors.   Gilles Chiquito, MD 07/28/21 [phone]

## 2021-07-29 LAB — URINE CULTURE: Culture: NO GROWTH

## 2021-08-08 ENCOUNTER — Ambulatory Visit: Payer: Medicaid Other

## 2021-08-08 ENCOUNTER — Encounter: Payer: Self-pay | Admitting: Advanced Practice Midwife

## 2021-08-08 ENCOUNTER — Other Ambulatory Visit: Payer: Self-pay

## 2021-08-08 ENCOUNTER — Ambulatory Visit: Payer: Medicaid Other | Admitting: Nurse Practitioner

## 2021-08-08 VITALS — BP 123/74 | HR 75 | Temp 97.4°F | Wt 169.0 lb

## 2021-08-08 DIAGNOSIS — K8080 Other cholelithiasis without obstruction: Secondary | ICD-10-CM

## 2021-08-08 DIAGNOSIS — R7989 Other specified abnormal findings of blood chemistry: Secondary | ICD-10-CM | POA: Diagnosis not present

## 2021-08-08 DIAGNOSIS — O0990 Supervision of high risk pregnancy, unspecified, unspecified trimester: Secondary | ICD-10-CM | POA: Insufficient documentation

## 2021-08-08 DIAGNOSIS — Z23 Encounter for immunization: Secondary | ICD-10-CM | POA: Diagnosis not present

## 2021-08-08 DIAGNOSIS — O0991 Supervision of high risk pregnancy, unspecified, first trimester: Secondary | ICD-10-CM | POA: Diagnosis not present

## 2021-08-08 DIAGNOSIS — Z98891 History of uterine scar from previous surgery: Secondary | ICD-10-CM

## 2021-08-08 DIAGNOSIS — O200 Threatened abortion: Secondary | ICD-10-CM | POA: Insufficient documentation

## 2021-08-08 DIAGNOSIS — O209 Hemorrhage in early pregnancy, unspecified: Secondary | ICD-10-CM

## 2021-08-08 LAB — HEMOGLOBIN, FINGERSTICK: Hemoglobin: 12.3 g/dL (ref 11.1–15.9)

## 2021-08-08 LAB — URINALYSIS
Bilirubin, UA: NEGATIVE
Glucose, UA: NEGATIVE
Ketones, UA: NEGATIVE
Leukocytes,UA: NEGATIVE
Nitrite, UA: NEGATIVE
Protein,UA: NEGATIVE
RBC, UA: NEGATIVE
Specific Gravity, UA: 1.03 (ref 1.005–1.030)
Urobilinogen, Ur: 0.2 mg/dL (ref 0.2–1.0)
pH, UA: 5 (ref 5.0–7.5)

## 2021-08-08 MED ORDER — PRENATAL VITAMIN 27-0.8 MG PO TABS: 1.0000 | ORAL_TABLET | Freq: Every day | ORAL | 0 refills | Status: DC

## 2021-08-08 NOTE — Progress Notes (Addendum)
Patient here for new OB visit at 12 4/7. States she has nausea all day and states she can't eat meat. Patient states she lives with her 2 children and the FOB is not the father of her other children. Patient states she had a baby at about 36 weeks in [place], and states she had an injection weekly for her other 2 pregnancies and may want that again (17-P?). Desires flu vaccine today. Needs ROI for 2013 C-section in [place]. Needs to sign 17-P consent if she wants 17-P. Normal Hgb in 2011, per paper chart. Patient states she did not have abnormal pap but had colpo in 2012.Burt Knack, RN

## 2021-08-08 NOTE — Progress Notes (Signed)
Makena form signed by client and in [facility] nurse workroom. Makena prescription form given to [facility] FNP. Client unable to remember name of hospital where had 2013 C-section. Client to look on child's birth certificate and bring name of hospital to next RV so ROI can be signed. Client tolerated flu vaccine without complaint. 4 week [facility] RV appt scheduled and appt reminder card given. Jossie Ng, RN Wet prep, hgb and urine dip reviewed - all normal. [employer] ID # [id number] used during posting of client. Jossie Ng, RN Glenna Fellows completed [place] prescription form and form is in [facility] nurse workroom. .mecred

## 2021-08-08 NOTE — Progress Notes (Deleted)
[place] [facility]  PRENATAL VISIT NOTE  Subjective:  Gabrielle Romero is a 31 y.o. [phone] at [redacted]w[redacted]d being seen today for ongoing prenatal care.  She is currently monitored for the following issues for this {Blank single:19197::"high-risk","low-risk"} pregnancy and has Abdominal pain; Cholelithiasis; Transaminitis; Biliary colic; Normal IUP (intrauterine pregnancy) on prenatal ultrasound; Elevated LFTs; RUQ pain; Vaginal bleeding affecting early pregnancy; First trimester pregnancy; and Positive D dimer on their problem list.  Patient reports {sx:14538}.   .  .   . ***Denies leaking of fluid/ROM.   The following portions of the patient's history were reviewed and updated as appropriate: allergies, current medications, past family history, past medical history, past social history, past surgical history and problem list. Problem list updated.  Objective:   Vitals:   08/08/21 0929  BP: 123/74  Pulse: 75  Temp: (!) 97.4 F (36.3 C)  Weight: 169 lb (76.7 kg)    Fetal Status:           General:  Alert, oriented and cooperative. Patient is in no acute distress.  Skin: Skin is warm and dry. No rash noted.   Cardiovascular: Normal heart rate noted  Respiratory: Normal respiratory effort, no problems with respiration noted  Abdomen: Soft, gravid, appropriate for gestational age.        Pelvic: {Blank single:19197::"Cervical exam performed","Cervical exam deferred"}        Extremities: Normal range of motion.     Mental Status: Normal mood and affect. Normal behavior. Normal judgment and thought content.   Assessment and Plan:  Pregnancy: [id number] at [redacted]w[redacted]d  There are no diagnoses linked to this encounter.  {Blank single:19197::"Term","Preterm"} labor symptoms and general obstetric precautions including but not limited to vaginal bleeding, contractions, leaking of fluid and fetal movement were reviewed in detail with the patient. Please refer to  After Visit Summary for other counseling recommendations.     How do you feel about this pregnancy? good Where/with whom do you live?  Patient lives with children.  Are you working? Patient is currently not working  Partner/FOB: Father of the baby is supporting her and the children.    Nausea? Nausea daily  Bleeding? Contractions? *** Been to ER this pregnancy (had u/s)? *** Sure of LMP? 10/229/2022  Chronic medical issues? None Medications? None Allergies? NKA Last pap: More than 2 years ago   Last dentist: over one year ago   Hx preterm birth? (want 17p?) Yes  Hx preeclampsia? Gest HTN? None Educate on pre-eclampsia s/sx: *** Discuss aspirin: ***  Hx cesarean? *** Want TOLAC or repeat c/s? ***  Fhx genetic abn? *** Discuss genetic tests: ***  [facility] or [facility]? ***   AMA? *** Gen counsel ref ***  Detailed u/s ref ***  PHQ-9 results: 5 [facility] ref? ***  Telehealth/BP cuff: ***  Covid vaccine: COVID vaccines, Flu shot [facility]   INITIAL PRENATAL VISIT NOTE  Subjective:  Gabrielle Romero is a 31 y.o. [phone] at [redacted]w[redacted]d being seen today to start prenatal care at the [facility].  She is currently monitored for the following issues for this {Blank single:19197::"high-risk","low-risk"} pregnancy and has Abdominal pain; Cholelithiasis; Transaminitis; Biliary colic; Normal IUP (intrauterine pregnancy) on prenatal ultrasound; Elevated LFTs; RUQ pain; Vaginal bleeding affecting early pregnancy; First trimester pregnancy; and Positive D dimer on their problem list.  Patient reports {sx:14538}.  Contractions: Not present. Vag. Bleeding: None.  Movement: Absent. Denies leaking of fluid.  Indications for ASA therapy (per uptodate) One of the following: Previous pregnancy with preeclampsia, especially early onset and with an adverse outcome No Multifetal gestation No Chronic hypertension No Type 1  or 2 diabetes mellitus No Chronic kidney disease No Autoimmune disease (antiphospholipid syndrome, systemic lupus erythematosus) No  Two or more of the following: Nulliparity No Obesity (body mass index >30 kg/m2) No Family history of preeclampsia in mother or sister No Age =35 years No Sociodemographic characteristics (African American race, low socioeconomic level) Yes Personal risk factors (eg, previous pregnancy with low birth weight or small for gestational age infant, previous adverse pregnancy outcome [eg, stillbirth], interval >10 years between pregnancies) No   The following portions of the patient's history were reviewed and updated as appropriate: allergies, current medications, past family history, past medical history, past social history, past surgical history and problem list. Problem list updated.  Objective:   Vitals:   08/08/21 0929  BP: 123/74  Pulse: 75  Temp: (!) 97.4 F (36.3 C)  Weight: 169 lb (76.7 kg)    Fetal Status:     Movement: Absent     *** Physical Exam  Assessment and Plan:  Pregnancy: [id number] at [redacted]w[redacted]d  There are no diagnoses linked to this encounter.   Discussed overview of care and coordination with inpatient delivery practices including [facility], [facility], [facility] and [facility].   Reviewed Centering pregnancy as standard of care at [facility], oriented to room and showed video. Based on EDD, plan for Cycle *** .    {Blank single:19197::"Term","Preterm"} labor symptoms and general obstetric precautions including but not limited to vaginal bleeding, contractions, leaking of fluid and fetal movement were reviewed in detail with the patient.  Please refer to After Visit Summary for other counseling recommendations.   No follow-ups on file.  No future appointments.  Gregary Cromer, FNP  Orders  ASA indicated? *** Urine drug indicated? *** PIH, UPCR indicated? ***  No follow-ups on file.  No future appointments.  Gregary Cromer,  FNP

## 2021-08-08 NOTE — Progress Notes (Signed)
[facility]   INITIAL PRENATAL VISIT NOTE  Subjective:  Gabrielle Romero is a 31 y.o. [phone] at [redacted]w[redacted]d being seen today to start prenatal care at the [facility].  She is currently monitored for the following issues for this high-risk pregnancy and has Abdominal pain; Cholelithiasis; Transaminitis; Biliary colic; Normal IUP (intrauterine pregnancy) on prenatal ultrasound; Elevated LFTs; RUQ pain; Vaginal bleeding affecting early pregnancy; First trimester pregnancy; Positive D dimer; Preterm delivery at 36 weeks in [place] 15 years ago; Miscarriage, threatened, early pregnancy; and Supervision of high risk pregnancy, antepartum on their problem list.  Patient reports nausea and lower abdominal discomfort.  Contractions: Not present. Vag. Bleeding: None.  Movement: Absent. Denies leaking of fluid.   Indications for ASA therapy (per uptodate) One of the following: Previous pregnancy with preeclampsia, especially early onset and with an adverse outcome No Multifetal gestation No Chronic hypertension No Type 1 or 2 diabetes mellitus No Chronic kidney disease No Autoimmune disease (antiphospholipid syndrome, systemic lupus erythematosus) No  Two or more of the following: Nulliparity No Obesity (body mass index >30 kg/m2) No Family history of preeclampsia in mother or sister No Age ?35 years No Sociodemographic characteristics (African American race, low socioeconomic level) Yes Personal risk factors (eg, previous pregnancy with low birth weight or small for gestational age infant, previous adverse pregnancy outcome [eg, stillbirth], interval >10 years between pregnancies) Yes    The following portions of the patient's history were reviewed and updated as appropriate: allergies, current medications, past family history, past medical history, past social history, past surgical history and problem list. Problem list  updated.  Objective:   Vitals:   08/08/21 0929  BP: 123/74  Pulse: 75  Temp: (!) 97.4 F (36.3 C)  Weight: 169 lb (76.7 kg)    Fetal Status: Fetal Heart Rate (bpm): 160 Fundal Height: 11 cm Movement: Absent  Presentation: Undeterminable   Physical Exam Vitals and nursing note reviewed.  Constitutional:      General: She is not in acute distress.    Appearance: Normal appearance. She is well-developed.  HENT:     Head: Normocephalic and atraumatic.     Right Ear: Tympanic membrane and external ear normal.     Left Ear: Tympanic membrane and external ear normal.     Nose: Nose normal. No congestion or rhinorrhea.     Mouth/Throat:     Lips: Pink.     Mouth: Mucous membranes are moist.     Dentition: Normal dentition. No dental caries.     Pharynx: Oropharynx is clear. Uvula midline.     Comments: Dentition: no visible signs of dental caries.  Last dental exam over one year ago.  Eyes:     General: No scleral icterus.    Conjunctiva/sclera: Conjunctivae normal.     Pupils: Pupils are equal, round, and reactive to light.  Neck:     Thyroid: No thyroid mass or thyromegaly.  Cardiovascular:     Rate and Rhythm: Normal rate and regular rhythm.     Pulses: Normal pulses.     Comments: Extremities are warm and well perfused Pulmonary:     Effort: Pulmonary effort is normal.     Breath sounds: Normal breath sounds.  Chest:     Chest wall: No mass.  Breasts:    Tanner Score is 5.     Breasts are symmetrical.     Right: Normal. No mass, nipple discharge or skin change.  Left: Normal. No mass, nipple discharge or skin change.  Abdominal:     General: Abdomen is flat.     Palpations: Abdomen is soft.     Tenderness: There is no abdominal tenderness.     Comments: Gravid   Genitourinary:    General: Normal vulva.     Exam position: Lithotomy position.     Pubic Area: No rash.      Labia:        Right: No rash.        Left: No rash.      Vagina: Normal. No vaginal  discharge.     Cervix: No cervical motion tenderness or friability.     Uterus: Normal. Enlarged (Gravid 11 size). Not tender.      Adnexa: Right adnexa normal and left adnexa normal.     Rectum: Normal. No external hemorrhoid.  Musculoskeletal:     Cervical back: Full passive range of motion without pain, normal range of motion and neck supple.     Right lower leg: No edema.     Left lower leg: No edema.  Lymphadenopathy:     Cervical: No cervical adenopathy.     Upper Body:     Right upper body: No axillary adenopathy.     Left upper body: No axillary adenopathy.  Skin:    General: Skin is warm and dry.     Capillary Refill: Capillary refill takes less than 2 seconds.  Neurological:     Mental Status: She is alert and oriented to person, place, and time.  Psychiatric:        Attention and Perception: Attention normal.        Mood and Affect: Mood normal.        Speech: Speech normal.        Behavior: Behavior is cooperative.    Assessment and Plan:  Pregnancy: [id number] at [redacted]w[redacted]d  1. Supervision of high risk pregnancy, antepartum -31 year old female in clinic for initial prenatal visit.  -Patient encouraged to take PNV daily.   - Reviewed total weight gain for pregnancy.  -Patient to the lab for routine bloodwork.  -Patient had been to the ED twice with reports of spotting, right upper quadrant pain.  These symptoms have subsided.  Patient reports today with lower abdominal discomfort and nausea.  Patient to be sent for a urine dip.  Will review labs.  Patient given resource sheet information regarding nausea.   - Dating: has sure LMP of 05/12/21, dating [place] not indicated  - Genetic screening: declines - Pregnancy sx: Denies N/V- counseling given if it developed  - Up to date on dental care. Reviewed safety of routine care in pregnancy  - Has support system that is involved  - Routine labs to determine anemia status given report  - Vaccinations: accepts flu today  -  Chlamydia/GC NAA, Confirmation - HIV-1/HIV-2 Qualitative RNA - Lead, blood (adult age 47 yrs or greater) - HCV Ab w Reflex to Quant PCR - QuantiFERON-TB Gold Plus - Prenatal profile without Varicella or Rubella - Varicella zoster antibody, IgG - Urine Culture - Hepatic function panel - Hemoglobin, venipuncture - WET PREP FOR TRICH, YEAST, CLUE - Urinalysis (Urine Dip) - IGP, Aptima HPV - Prenatal Vit-Fe Fumarate-FA (PRENATAL VITAMIN) 27-0.8 MG TABS; Take 1 tablet by mouth daily at 6 (six) AM.  Dispense: 100 tablet; Refill: 0  2. Biliary calculus of other site without obstruction - Patient reported to the ED on 06/25/21 for spotting and  right upper quadrant.  Right upper quadrant pain.  Patient diagnosed with cholelithiasis without sonographic evidence of acute cholecystitis.  LFTs elevated during this visit.  Patient given Tylenol as supportive therapy.  Patient states right upper quadrant pain has subsided. Will draw LFTs today.  - Hepatic function panel.   3. Elevated LFTs - Patient reported to the ED on 06/25/21 for spotting and right upper quadrant pain.  Blood work completed at time of visit and elevated LFTs noted.  Patient had offfice visit on 07/05/21 with [facility].  LFTs on 07/05/21 was AST=16 and ALT=63.  Will draw LFTs today.  - Hepatic function panel   4. Preterm delivery at 36 weeks in [place] 15 years ago -Patient states she had a preterm delivery 15 years ago at 37 weeks.  Patient states that 32 hours after delivery her baby passed away.  Patient states she is unsure of cause of death but does remember that the baby's lungs were undeveloped. Patient reports taking 17P for other pregnancies and would like 17P for current pregnancy.     5. Vaginal bleeding affecting early pregnancy -Patient reported to the ED on 06/25/21 for spotting and right upper quadrant pain.  U/S completed in ED.  Patient on 06/25/21 [redacted]w[redacted]d, yolk sac visualized and embryo not visualized.  Patient reports that  she is currently not spotting.  Encouraged to notify clinic if symptoms occur.        Discussed overview of care and coordination with inpatient delivery practices including [facility], [facility], [facility] and [facility].    Term labor symptoms and general obstetric precautions including but not limited to vaginal bleeding, contractions, leaking of fluid and fetal movement were reviewed in detail with the patient.  Please refer to After Visit Summary for other counseling recommendations.   Return in about 4 weeks (around 09/05/2021) for Routine prenatal care visit.  Future Appointments  Date Time Provider [place]  09/05/2021 10:00 AM [facility] PROVIDER [facility] None    Gregary Cromer, FNP

## 2021-08-09 ENCOUNTER — Encounter: Payer: Self-pay | Admitting: Nurse Practitioner

## 2021-08-09 DIAGNOSIS — Z98891 History of uterine scar from previous surgery: Secondary | ICD-10-CM | POA: Insufficient documentation

## 2021-08-09 LAB — CBC/D/PLT+RPR+RH+ABO+AB SCR
Antibody Screen: NEGATIVE
Basophils Absolute: 0 10*3/uL (ref 0.0–0.2)
Basos: 0 %
EOS (ABSOLUTE): 0 10*3/uL (ref 0.0–0.4)
Eos: 0 %
Hematocrit: 37.2 % (ref 34.0–46.6)
Hemoglobin: 12.5 g/dL (ref 11.1–15.9)
Hepatitis B Surface Ag: NEGATIVE
Immature Grans (Abs): 0.1 10*3/uL (ref 0.0–0.1)
Immature Granulocytes: 1 %
Lymphocytes Absolute: 2.3 10*3/uL (ref 0.7–3.1)
Lymphs: 22 %
MCH: 28.9 pg (ref 26.6–33.0)
MCHC: 33.6 g/dL (ref 31.5–35.7)
MCV: 86 fL (ref 79–97)
Monocytes Absolute: 0.7 10*3/uL (ref 0.1–0.9)
Monocytes: 6 %
Neutrophils Absolute: 7.4 10*3/uL — ABNORMAL HIGH (ref 1.4–7.0)
Neutrophils: 71 %
Platelets: 228 10*3/uL (ref 150–450)
RBC: 4.32 x10E6/uL (ref 3.77–5.28)
RDW: 13.2 % (ref 11.7–15.4)
RPR Ser Ql: NONREACTIVE
Rh Factor: POSITIVE
WBC: 10.5 10*3/uL (ref 3.4–10.8)

## 2021-08-09 LAB — HEPATIC FUNCTION PANEL
ALT: 16 IU/L (ref 0–32)
AST: 15 IU/L (ref 0–40)
Albumin: 4.2 g/dL (ref 3.8–4.8)
Alkaline Phosphatase: 60 IU/L (ref 44–121)
Bilirubin Total: <0.2 mg/dL (ref 0.0–1.2)
Bilirubin, Direct: <0.1 mg/dL (ref 0.00–0.40)
Total Protein: 7 g/dL (ref 6.0–8.5)

## 2021-08-09 LAB — HCV AB W REFLEX TO QUANT PCR: HCV Ab: <0.1 s/co ratio (ref 0.0–0.9)

## 2021-08-09 LAB — HCV INTERPRETATION

## 2021-08-09 MED ORDER — HYDROXYPROGESTERONE CAPROATE 275 MG/1.1ML ~~LOC~~ SOAJ: 275.0000 mg | SUBCUTANEOUS | Status: DC

## 2021-08-09 NOTE — Progress Notes (Signed)
Makena referral faxed for 17-P. Confirmation received.Burt Knack, RN

## 2021-08-10 LAB — URINE CULTURE

## 2021-08-10 LAB — CHLAMYDIA/GC NAA, CONFIRMATION
Chlamydia trachomatis, NAA: NEGATIVE
Neisseria gonorrhoeae, NAA: NEGATIVE

## 2021-08-11 LAB — QUANTIFERON-TB GOLD PLUS
QuantiFERON Mitogen Value: >10 IU/mL
QuantiFERON Nil Value: 0.04 IU/mL
QuantiFERON TB1 Ag Value: 0.08 IU/mL
QuantiFERON TB2 Ag Value: 0.02 IU/mL
QuantiFERON-TB Gold Plus: NEGATIVE

## 2021-08-11 LAB — IGP, APTIMA HPV
HPV Aptima: NEGATIVE
PAP Smear Comment: 0

## 2021-08-11 LAB — HIV-1/HIV-2 QUALITATIVE RNA
HIV-1 RNA, Qualitative: NONREACTIVE
HIV-2 RNA, Qualitative: NONREACTIVE

## 2021-08-11 LAB — LEAD, BLOOD (ADULT >= 16 YRS): Lead-Whole Blood: 2.8 ug/dL (ref 0.0–3.4)

## 2021-08-11 LAB — VARICELLA ZOSTER ANTIBODY, IGG: Varicella zoster IgG: <135 index — ABNORMAL LOW (ref >165)

## 2021-08-13 DIAGNOSIS — Z2839 Other underimmunization status: Secondary | ICD-10-CM | POA: Insufficient documentation

## 2021-08-14 ENCOUNTER — Telehealth: Payer: Self-pay | Admitting: Family Medicine

## 2021-08-14 NOTE — Telephone Encounter (Signed)
Pt was asked about the amount of weeks of the previous pregnancy (in which the baby died) and she said it was 36wks, but now confirms that it was 29wks as stated in her medical record. Please call her with an interpreter if you need additional info. Thanks

## 2021-08-22 ENCOUNTER — Other Ambulatory Visit: Payer: Self-pay | Admitting: Family Medicine

## 2021-08-22 MED ORDER — HYDROXYPROGESTERONE CAPROATE 250 MG/ML IM OIL: 250.0000 mg | TOPICAL_OIL | INTRAMUSCULAR | Status: DC

## 2021-08-22 NOTE — Progress Notes (Signed)
Sent in IM 17-P

## 2021-08-23 ENCOUNTER — Emergency Department: Payer: Self-pay

## 2021-08-23 ENCOUNTER — Emergency Department
Admission: EM | Admit: 2021-08-23 | Discharge: 2021-08-24 | Disposition: A | Discharge status: Home / Self Care | Payer: Self-pay | Attending: Emergency Medicine | Admitting: Emergency Medicine

## 2021-08-23 ENCOUNTER — Other Ambulatory Visit: Payer: Self-pay

## 2021-08-23 DIAGNOSIS — O4690 Antepartum hemorrhage, unspecified, unspecified trimester: Secondary | ICD-10-CM

## 2021-08-23 DIAGNOSIS — R102 Pelvic and perineal pain: Secondary | ICD-10-CM | POA: Insufficient documentation

## 2021-08-23 DIAGNOSIS — N939 Abnormal uterine and vaginal bleeding, unspecified: Secondary | ICD-10-CM | POA: Insufficient documentation

## 2021-08-23 DIAGNOSIS — Z3A14 14 weeks gestation of pregnancy: Secondary | ICD-10-CM | POA: Insufficient documentation

## 2021-08-23 DIAGNOSIS — O209 Hemorrhage in early pregnancy, unspecified: Secondary | ICD-10-CM | POA: Insufficient documentation

## 2021-08-23 DIAGNOSIS — R58 Hemorrhage, not elsewhere classified: Secondary | ICD-10-CM

## 2021-08-23 LAB — CBC
HCT: 34.9 % — ABNORMAL LOW (ref 36.0–46.0)
Hemoglobin: 11.6 g/dL — ABNORMAL LOW (ref 12.0–15.0)
MCH: 28.7 pg (ref 26.0–34.0)
MCHC: 33.2 g/dL (ref 30.0–36.0)
MCV: 86.4 fL (ref 80.0–100.0)
Platelets: 187 10*3/uL (ref 150–400)
RBC: 4.04 MIL/uL (ref 3.87–5.11)
RDW: 13 % (ref 11.5–15.5)
WBC: 9.5 10*3/uL (ref 4.0–10.5)
nRBC: 0 % (ref 0.0–0.2)

## 2021-08-23 LAB — URINALYSIS, ROUTINE W REFLEX MICROSCOPIC
Bacteria, UA: NONE SEEN
Bilirubin Urine: NEGATIVE
Glucose, UA: NEGATIVE mg/dL
Ketones, ur: NEGATIVE mg/dL
Leukocytes,Ua: NEGATIVE
Nitrite: NEGATIVE
Protein, ur: NEGATIVE mg/dL
Specific Gravity, Urine: 1.005 (ref 1.005–1.030)
pH: 7 (ref 5.0–8.0)

## 2021-08-23 LAB — COMPREHENSIVE METABOLIC PANEL
ALT: 13 U/L (ref 0–44)
AST: 18 U/L (ref 15–41)
Albumin: 3.4 g/dL — ABNORMAL LOW (ref 3.5–5.0)
Alkaline Phosphatase: 45 U/L (ref 38–126)
Anion gap: 4 — ABNORMAL LOW (ref 5–15)
BUN: 10 mg/dL (ref 6–20)
CO2: 20 mmol/L — ABNORMAL LOW (ref 22–32)
Calcium: 8.9 mg/dL (ref 8.9–10.3)
Chloride: 110 mmol/L (ref 98–111)
Creatinine, Ser: 0.57 mg/dL (ref 0.44–1.00)
GFR, Estimated: >60 mL/min (ref >60)
Glucose, Bld: 132 mg/dL — ABNORMAL HIGH (ref 70–99)
Potassium: 3.8 mmol/L (ref 3.5–5.1)
Sodium: 134 mmol/L — ABNORMAL LOW (ref 135–145)
Total Bilirubin: 0.2 mg/dL — ABNORMAL LOW (ref 0.3–1.2)
Total Protein: 6.8 g/dL (ref 6.5–8.1)

## 2021-08-23 LAB — HCG, QUANTITATIVE, PREGNANCY: hCG, Beta Chain, Quant, S: 53893 m[IU]/mL — ABNORMAL HIGH (ref <5)

## 2021-08-23 NOTE — ED Triage Notes (Signed)
Pt reports she is [redacted] weeks pregnant, started bleeding tonight about 30 minutes ago.

## 2021-08-24 ENCOUNTER — Emergency Department: Payer: Self-pay

## 2021-08-24 NOTE — ED Provider Notes (Signed)
[facility] Provider Note    Event Date/Time   First MD Initiated Contact with Patient 08/24/21 0001     (approximate)   History   Vaginal Bleeding   HPI  Gabrielle Romero is a 31 y.o. female  G5P2 who presents to the emergency department with vaginal bleeding.  Patient is currently pregnant with an LMP of October 29.  She has been followed at the health department.  She has had an ultrasound confirming a single intrauterine pregnancy.  States she noticed bright red vaginal bleeding tonight but is not passing any clots or tissue.  States bleeding has now stopped.  No abdominal pain, fevers, vomiting, abnormal vaginal discharge.  Denies any previous history of STDs and declines testing today.  States she was negative for STDs at the health department.  She has had 2 previous full-time deliveries.  She had 1 preterm delivery at 29 weeks and states that infant did not survive.  She is also had 1 previous spontaneous miscarriage.   Patient would be 14 weeks, 6 days based on dates.  History provided by patient using Spanish interpreter.    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 2005 or 2006   due to accident; no vision right eye   SPINE SURGERY  ???2017   states surgery on spine due to a disc problem    MEDICATIONS:  Prior to Admission medications   Medication Sig Start Date End Date Taking?  Authorizing Provider  Prenatal Vit-Fe Fumarate-FA (MULTIVITAMIN-PRENATAL) 27-0.8 MG TABS tablet Take 1 tablet by mouth daily at 12 noon.    [provider]  Prenatal Vit-Fe Fumarate-FA (PRENATAL VITAMIN) 27-0.8 MG TABS Take 1 tablet by mouth daily at 6 (six) AM. 08/08/21   Federico Flake, MD    Physical Exam   Triage Vital Signs: ED Triage Vitals  Enc Vitals Group     BP 08/23/21 2221 139/79     Pulse Rate 08/23/21 2221 (!) 105     Resp 08/23/21 2221 18     Temp 08/23/21 2221 99.1 F (37.3 C)     Temp Source 08/23/21 2221 Oral     SpO2 08/23/21 2221 99 %     Weight 08/23/21 2224 169 lb 1.5 oz (76.7 kg)     Height 08/23/21 2224 5\' 4"  (1.626 m)     Head Circumference --      Peak Flow --      Pain Score 08/23/21 2223 5     Pain Loc --      Pain Edu? --      Excl. in GC? --     Most recent vital signs: Vitals:   08/23/21 2221 08/24/21 0110  BP: 139/79 116/70  Pulse: (!) 105 79  Resp: 18 18  Temp:  99.1 F (37.3 C)   SpO2: 99% 100%    CONSTITUTIONAL: Alert and oriented and responds appropriately to questions. Well-appearing; well-nourished HEAD: Normocephalic, atraumatic EYES: Conjunctivae clear, pupils appear equal, sclera nonicteric ENT: normal nose; moist mucous membranes NECK: Supple, normal ROM CARD: RRR; S1 and S2 appreciated; no murmurs, no clicks, no rubs, no gallops RESP: Normal chest excursion without splinting or tachypnea; breath sounds clear and equal bilaterally; no wheezes, no rhonchi, no rales, no hypoxia or respiratory distress, speaking full sentences ABD/GI: Normal bowel sounds; non-distended; soft, non-tender, no rebound, no guarding, no peritoneal signs GU: Patient declines BACK: The back appears normal EXT: Normal ROM in all joints; no deformity noted, no edema; no cyanosis SKIN: Normal color for age and race; warm; no rash on exposed skin NEURO: Moves all extremities equally, normal speech PSYCH: The patient's mood and manner are  appropriate.   ED Results / Procedures / Treatments   LABS: (all labs ordered are listed, but only abnormal results are displayed) Labs Reviewed  HCG, QUANTITATIVE, PREGNANCY - Abnormal; Notable for the following components:      Result Value   hCG, Beta Chain, Quant, S 28,366 (*)    All other components within normal limits  CBC - Abnormal; Notable for the following components:   Hemoglobin 11.6 (*)    HCT 34.9 (*)    All other components within normal limits  COMPREHENSIVE METABOLIC PANEL - Abnormal; Notable for the following components:   Sodium 134 (*)    CO2 20 (*)    Glucose, Bld 132 (*)    Albumin 3.4 (*)    Total Bilirubin 0.2 (*)    Anion gap 4 (*)    All other components within normal limits  URINALYSIS, ROUTINE W REFLEX MICROSCOPIC - Abnormal; Notable for the following components:   Color, Urine STRAW (*)    APPearance CLEAR (*)    Hgb urine dipstick LARGE (*)    All other components within normal limits  POC URINE PREG, ED     EKG:  EKG Interpretation  Date/Time:    Ventricular Rate:    PR Interval:    QRS Duration:   QT Interval:    QTC Calculation:   R Axis:     Text Interpretation:           RADIOLOGY: My personal review and interpretation of imaging: Ultrasound shows normal blood flow to both ovaries.  She has a single intrauterine pregnancy with normal fetal cardiac activity.  No subchorionic hemorrhage.  I have personally reviewed all radiology reports.   US OB Limited  Result Date: 08/24/2021 CLINICAL DATA:  Vaginal bleeding and pelvic pain tonight. Estimated gestational age by LMP is 14 weeks 5 days. EXAM: LIMITED OBSTETRIC ULTRASOUND COMPARISON:  07/28/2021 FINDINGS: Number of Fetuses: 1 Heart Rate:  152 bpm Movement: Fetal movement is observed. Presentation: Cephalic presentation. Placental Location: Posterior Previa: No Amniotic Fluid (Subjective):  Within normal limits. BPD: 2.7 cm 14 w  6 d MATERNAL FINDINGS: Cervix:  Cervix appears  closed. Uterus/Adnexae: Both ovaries are visualized and appear normal. Corpus luteal cyst on the left ovary. Spectral doppler imaging of the ovaries demonstrates normal arterial and venous waveform patterns bilaterally. Flow is demonstrated in both ovaries on color flow Doppler imaging. IMPRESSION: 1. Single intrauterine pregnancy. Fetus is in cephalic presentation. Fetal movement and cardiac activity are observed. No acute complication is demonstrated sonographically on limited evaluation. 2. Normal appearance of the ovaries with normal flow demonstrated on Doppler imaging. This exam  is performed on an emergent basis and does not comprehensively evaluate fetal size, dating, or anatomy; follow-up complete OB US should be considered if further fetal assessment is warranted. Electronically Signed   By: Burman Nieves M.D.   On: 08/24/2021 01:12   US ABDOMINAL PELVIC ART/VENT FLOW DOPPLER  Result Date: 08/24/2021 CLINICAL DATA:  Vaginal bleeding and pelvic pain tonight. Estimated gestational age by LMP is 14 weeks 5 days. EXAM: LIMITED OBSTETRIC ULTRASOUND COMPARISON:  07/28/2021 FINDINGS: Number of Fetuses: 1 Heart Rate:  152 bpm Movement: Fetal movement is observed. Presentation: Cephalic presentation. Placental Location: Posterior Previa: No Amniotic Fluid (Subjective):  Within normal limits. BPD: 2.7 cm 14 w  6 d MATERNAL FINDINGS: Cervix:  Cervix appears closed. Uterus/Adnexae: Both ovaries are visualized and appear normal. Corpus luteal cyst on the left ovary. Spectral doppler imaging of the ovaries demonstrates normal arterial and venous waveform patterns bilaterally. Flow is demonstrated in both ovaries on color flow Doppler imaging. IMPRESSION: 1. Single intrauterine pregnancy. Fetus is in cephalic presentation. Fetal movement and cardiac activity are observed. No acute complication is demonstrated sonographically on limited evaluation. 2. Normal appearance of the ovaries with normal flow demonstrated on  Doppler imaging. This exam is performed on an emergent basis and does not comprehensively evaluate fetal size, dating, or anatomy; follow-up complete OB US should be considered if further fetal assessment is warranted. Electronically Signed   By: Burman Nieves M.D.   On: 08/24/2021 01:12     PROCEDURES:  Critical Care performed: No     Procedures    IMPRESSION / MDM / ASSESSMENT AND PLAN / ED COURSE  I reviewed the triage vital signs and the nursing notes.    Patient here with vaginal bleeding and pregnancy.  The patient is on the cardiac monitor to evaluate for evidence of arrhythmia and/or significant heart rate changes.   DIFFERENTIAL DIAGNOSIS (includes but not limited to):   Miscarriage, ectopic, subchorionic hemorrhage, STD, UTI, torsion, TOA central previa, placental abruption.  Doubt appendicitis.   PLAN: We will obtain labs, urine, transvaginal ultrasound with Doppler.  She declines any pain medication at this time.  She declines pelvic exam and STD screening stating that she had negative STD testing at the health department recently.   MEDICATIONS GIVEN IN ED: Medications - No data to display   ED COURSE: Patient's labs have been reviewed by myself and showed normal hemoglobin.  Urine shows no infection, only a small amount of blood likely from her recent vaginal bleeding.  No significant electrolyte derangement.  Normal LFTs.  hCG is 53,000.  Previous blood type is documented as O+ on 08/08/2021.  She does not need RhoGAM.  Ultrasound reviewed by myself and radiologist and shows normal single intrauterine pregnancy with normal fetal cardiac activity.  No sign of placental previa, subchorionic hemorrhage.  I feel she is safe to be discharged home.  Recommended that she rest over the next few days and recommended pelvic rest.  Recommend close follow-up with her OB/GYN.  We discussed bleeding return precautions using the Spanish interpreter.  Patient is comfortable with  this plan.  No prescriptions needed at this time.  At this time, I do not feel there is any life-threatening condition present. I reviewed all nursing notes, vitals, pertinent previous records.  All lab and urine results, EKGs, imaging ordered have been independently reviewed and interpreted by myself.  I reviewed all available radiology reports from any imaging ordered this visit.  Based on my assessment, I feel  the patient is safe to be discharged home without further emergent workup and can continue workup as an outpatient as needed. Discussed all findings, treatment plan as well as usual and customary return precautions with patient.  They verbalize understanding and are comfortable with this plan.  Outpatient follow-up has been provided as needed.  All questions have been answered.    CONSULTS: No OB/GYN consult needed given vaginal bleeding has stopped per patient work-up today is reassuring.  Patient hemodynamically stable.   OUTSIDE RECORDS REVIEWED: Reviewed records from health department visit on 08/08/2021.         FINAL CLINICAL IMPRESSION(S) / ED DIAGNOSES   Final diagnoses:  Bleeding  Vaginal bleeding in pregnancy  Pelvic pain     Rx / DC Orders   ED Discharge Orders     None        Note:  This document was prepared using Dragon voice recognition software and may include unintentional dictation errors.   Caridad Silveira, Layla MawKristen N, DO 08/24/21 0214

## 2021-08-28 NOTE — Progress Notes (Signed)
TC to [facility] to inquire about status of patient referral for 17-P. Per several phone calls to [facility] and specialty pharmacy last week, referral was mistakenly sent to a specialty pharmacy that can only provide generic 17-P to patient with a particular insurance carrier. [facility] does not support generic 17-P, and patient does not have insurance. Today, Roger Kill, at [facility] states they have taken patient's insurance status into account and are in the process of calling patient to verify her information. Patient did not answer their 08/23/21 call and they will call her again today. They would like to patient to call them at [phone] if they are unable to reach her today. Per Darien Ramus, they will let this RN know if they cannot reach client. After they verify patient information with the patient, they will schedule first shipment of 17-P.Marland KitchenMarland KitchenBurt Knack, RN

## 2021-08-29 ENCOUNTER — Ambulatory Visit: Payer: Self-pay | Admitting: Advanced Practice Midwife

## 2021-08-29 ENCOUNTER — Other Ambulatory Visit: Payer: Self-pay

## 2021-08-29 DIAGNOSIS — O209 Hemorrhage in early pregnancy, unspecified: Secondary | ICD-10-CM

## 2021-08-29 DIAGNOSIS — O0992 Supervision of high risk pregnancy, unspecified, second trimester: Secondary | ICD-10-CM

## 2021-08-29 DIAGNOSIS — R7989 Other specified abnormal findings of blood chemistry: Secondary | ICD-10-CM

## 2021-08-29 DIAGNOSIS — O0990 Supervision of high risk pregnancy, unspecified, unspecified trimester: Secondary | ICD-10-CM

## 2021-08-29 DIAGNOSIS — Z98891 History of uterine scar from previous surgery: Secondary | ICD-10-CM

## 2021-08-29 NOTE — Progress Notes (Signed)
[facility]  PRENATAL VISIT NOTE  Subjective:  Gabrielle Romero is a 31 y.o. [phone] at [redacted]w[redacted]d being seen today for ongoing prenatal care.  She is currently monitored for the following issues for this high-risk pregnancy and has Abdominal pain; Cholelithiasis; Transaminitis; Biliary colic; Elevated LFTs; Vaginal bleeding affecting early pregnancy; Positive D dimer; Preterm delivery in [place] 15 years ago PPROM at 29 wks per pt and lived x 32 hrs; Supervision of high risk pregnancy, antepartum; History of C-section x 3; Neonatal death; and Maternal varicella, non-immune on their problem list.  Patient reports no complaints.  Contractions: Not present. Vag. Bleeding: None.  Movement: Absent. Denies leaking of fluid/ROM.   The following portions of the patient's history were reviewed and updated as appropriate: allergies, current medications, past family history, past medical history, past social history, past surgical history and problem list. Problem list updated.  Objective:   Vitals:   08/29/21 1419  BP: 122/63  Pulse: 86  Temp: (!) 97.3 F (36.3 C)  Weight: 170 lb 12.8 oz (77.5 kg)    Fetal Status: Fetal Heart Rate (bpm): 160 Fundal Height: 16 cm Movement: Absent     General:  Alert, oriented and cooperative. Patient is in no acute distress.  Skin: Skin is warm and dry. No rash noted.   Cardiovascular: Normal heart rate noted  Respiratory: Normal respiratory effort, no problems with respiration noted  Abdomen: Soft, gravid, appropriate for gestational age.  Pain/Pressure: Absent     Pelvic: Cervical exam deferred        Extremities: Normal range of motion.  Edema: None  Mental Status: Normal mood and affect. Normal behavior. Normal judgment and thought content.   Assessment and Plan:  Pregnancy: [id number] at [redacted]w[redacted]d  1. Supervision of high risk pregnancy, antepartum 6 lb 12.8 oz (3.084 kg) Here with female friend Not exercising.  Not working. Living with her 2 kids (11,9) Went to ER 08/23/21 for bleeding after sex   2. Elevated LFTs Wnl on 08/08/21  3. Preterm delivery in [place] 12/14/06 PPROM at 29 wks per pt and lived x 32 hrs Pt desires 17-P as has had with her other 2 pregnancies PPROM with first and second pregancy; third baby in ICU x 15 days on 05/22/12   4. History of C-section x 3 Needs delivery plans apt with [facility] for repeat c/s  5. Vaginal bleeding affecting early pregnancy Went to ER 08/23/21 for red bleeding after sex. Reviewed 08/24/21 u/s at 14 6/7 with posterior placenta, no previa, left corpus luteal cyst States stopped spotting 08/27/21   Preterm labor symptoms and general obstetric precautions including but not limited to vaginal bleeding, contractions, leaking of fluid and fetal movement were reviewed in detail with the patient. Please refer to After Visit Summary for other counseling recommendations.  Return in about 1 week (around 09/05/2021).  Future Appointments  Date Time Provider [place]  09/05/2021  2:00 PM [facility] PROVIDER [facility] None    Herbie Saxon, CNM

## 2021-08-29 NOTE — Progress Notes (Signed)
Here for a rescheduled MH RV from 2/22 to today follow up from a 08/23/21 ED visit for vaginal bleeding. Taking PNV QD. Counseled on Varicella Non-Immune, handout given. Did bring information on 2013 C-Section. ROI signed for post op report. Tawny Hopping, RN

## 2021-08-30 ENCOUNTER — Telehealth: Payer: Self-pay

## 2021-08-30 ENCOUNTER — Other Ambulatory Visit: Payer: Self-pay | Admitting: Physician Assistant

## 2021-08-30 MED ORDER — MAKENA 275 MG/1.1ML ~~LOC~~ SOAJ: 275.0000 mg | SUBCUTANEOUS | 4 refills | Status: AC

## 2021-08-30 NOTE — Telephone Encounter (Signed)
Returned call to [facility], patient has been approved for medication assistance with 17-P. Dr. Alvester Morin name provided as pharmacy needs attending physician name. Address for [facility] verified for shipment of 17-P. First shipment to arrive 09/05/21, per pharmacy, and shipments will be automatically sent every 3-4 weeks.Burt Knack, RN

## 2021-09-05 ENCOUNTER — Ambulatory Visit: Payer: Self-pay

## 2021-09-05 ENCOUNTER — Ambulatory Visit: Payer: Self-pay | Admitting: Advanced Practice Midwife

## 2021-09-05 ENCOUNTER — Other Ambulatory Visit: Payer: Self-pay

## 2021-09-05 VITALS — BP 114/69 | HR 76 | Temp 97.8°F | Wt 170.6 lb

## 2021-09-05 DIAGNOSIS — O0990 Supervision of high risk pregnancy, unspecified, unspecified trimester: Secondary | ICD-10-CM

## 2021-09-05 DIAGNOSIS — Z98891 History of uterine scar from previous surgery: Secondary | ICD-10-CM

## 2021-09-05 DIAGNOSIS — O0992 Supervision of high risk pregnancy, unspecified, second trimester: Secondary | ICD-10-CM

## 2021-09-05 NOTE — Progress Notes (Addendum)
Here today for 15.5 week MH RV. Taking PNV QD. Declines Quad screen today. Denies ED/hospital visits since last RV. Initial 17P appt scheduled for 09/07/21. Verified 2013 delivery hospital site. Patient to bring daughters birth certificate with her to 2/24 appt for further verification of delivery site. Hal Morales, RN

## 2021-09-05 NOTE — Progress Notes (Signed)
[place] [facility]  PRENATAL VISIT NOTE  Subjective:  Gabrielle Romero is a 31 y.o. [phone] at [redacted]w[redacted]d being seen today for ongoing prenatal care.  She is currently monitored for the following issues for this high-risk pregnancy and has Abdominal pain; Cholelithiasis; Transaminitis; Biliary colic; Elevated LFTs; Vaginal bleeding affecting early pregnancy; Positive D dimer; Preterm delivery in [place] 15 years ago PPROM at 29 wks per pt and lived x 32 hrs; Supervision of high risk pregnancy, antepartum; History of C-section x 3; Neonatal death; and Maternal varicella, non-immune on their problem list.  Patient reports no complaints.  Contractions: Not present. Vag. Bleeding: None.  Movement: Absent. Denies leaking of fluid/ROM.   The following portions of the patient's history were reviewed and updated as appropriate: allergies, current medications, past family history, past medical history, past social history, past surgical history and problem list. Problem list updated.  Objective:   Vitals:   09/05/21 1421  BP: 114/69  Pulse: 76  Temp: 97.8 F (36.6 C)  Weight: 170 lb 9.6 oz (77.4 kg)    Fetal Status: Fetal Heart Rate (bpm): 150 Fundal Height: 18 cm Movement: Absent     General:  Alert, oriented and cooperative. Patient is in no acute distress.  Skin: Skin is warm and dry. No rash noted.   Cardiovascular: Normal heart rate noted  Respiratory: Normal respiratory effort, no problems with respiration noted  Abdomen: Soft, gravid, appropriate for gestational age.  Pain/Pressure: Absent     Pelvic: Cervical exam deferred        Extremities: Normal range of motion.  Edema: None  Mental Status: Normal mood and affect. Normal behavior. Normal judgment and thought content.   Assessment and Plan:  Pregnancy: [id number] at [redacted]w[redacted]d  1. Supervision of high risk pregnancy, antepartum 6 lb 9.6 oz (2.994 kg) Walking 5x/wk x 10-15 min Not working Declines  Quad screen  2. History of C-section x 3 Needs [place] delivery plans apt for repeat c/s  3. Preterm delivery in [place] 15 years ago PPROM at 72 wks per pt and lived x 32 hrs Wants to begin 17-P on 09/07/21   Preterm labor symptoms and general obstetric precautions including but not limited to vaginal bleeding, contractions, leaking of fluid and fetal movement were reviewed in detail with the patient. Please refer to After Visit Summary for other counseling recommendations.  Return in about 4 weeks (around 10/03/2021) for 17-P on 09/07/21, routine PNC.  Future Appointments  Date Time Provider [place]  09/07/2021 10:50 AM [facility] NURSE [facility] None    Herbie Saxon, CNM

## 2021-09-06 ENCOUNTER — Other Ambulatory Visit: Payer: Self-pay

## 2021-09-06 ENCOUNTER — Emergency Department
Admission: EM | Admit: 2021-09-06 | Discharge: 2021-09-07 | Disposition: A | Discharge status: Home / Self Care | Payer: Medicaid Other | Attending: Emergency Medicine | Admitting: Emergency Medicine

## 2021-09-06 ENCOUNTER — Encounter: Payer: Self-pay | Admitting: *Deleted

## 2021-09-06 DIAGNOSIS — Z3A16 16 weeks gestation of pregnancy: Secondary | ICD-10-CM | POA: Diagnosis not present

## 2021-09-06 DIAGNOSIS — N9489 Other specified conditions associated with female genital organs and menstrual cycle: Secondary | ICD-10-CM | POA: Diagnosis not present

## 2021-09-06 DIAGNOSIS — O418X20 Other specified disorders of amniotic fluid and membranes, second trimester, not applicable or unspecified: Secondary | ICD-10-CM | POA: Diagnosis not present

## 2021-09-06 DIAGNOSIS — O200 Threatened abortion: Secondary | ICD-10-CM | POA: Diagnosis not present

## 2021-09-06 DIAGNOSIS — O2692 Pregnancy related conditions, unspecified, second trimester: Secondary | ICD-10-CM | POA: Diagnosis present

## 2021-09-06 DIAGNOSIS — O209 Hemorrhage in early pregnancy, unspecified: Secondary | ICD-10-CM

## 2021-09-06 LAB — URINALYSIS, ROUTINE W REFLEX MICROSCOPIC
Bilirubin Urine: NEGATIVE
Glucose, UA: NEGATIVE mg/dL
Hgb urine dipstick: NEGATIVE
Ketones, ur: NEGATIVE mg/dL
Leukocytes,Ua: NEGATIVE
Nitrite: NEGATIVE
Protein, ur: NEGATIVE mg/dL
Specific Gravity, Urine: 1.011 (ref 1.005–1.030)
pH: 8 (ref 5.0–8.0)

## 2021-09-06 LAB — COMPREHENSIVE METABOLIC PANEL
ALT: 17 U/L (ref 0–44)
AST: 20 U/L (ref 15–41)
Albumin: 3.2 g/dL — ABNORMAL LOW (ref 3.5–5.0)
Alkaline Phosphatase: 48 U/L (ref 38–126)
Anion gap: 6 (ref 5–15)
BUN: 9 mg/dL (ref 6–20)
CO2: 20 mmol/L — ABNORMAL LOW (ref 22–32)
Calcium: 8.6 mg/dL — ABNORMAL LOW (ref 8.9–10.3)
Chloride: 109 mmol/L (ref 98–111)
Creatinine, Ser: 0.32 mg/dL — ABNORMAL LOW (ref 0.44–1.00)
GFR, Estimated: >60 mL/min (ref >60)
Glucose, Bld: 125 mg/dL — ABNORMAL HIGH (ref 70–99)
Potassium: 3.6 mmol/L (ref 3.5–5.1)
Sodium: 135 mmol/L (ref 135–145)
Total Bilirubin: 0.3 mg/dL (ref 0.3–1.2)
Total Protein: 6.6 g/dL (ref 6.5–8.1)

## 2021-09-06 LAB — CBC
HCT: 34.9 % — ABNORMAL LOW (ref 36.0–46.0)
Hemoglobin: 11.7 g/dL — ABNORMAL LOW (ref 12.0–15.0)
MCH: 28.3 pg (ref 26.0–34.0)
MCHC: 33.5 g/dL (ref 30.0–36.0)
MCV: 84.3 fL (ref 80.0–100.0)
Platelets: 192 10*3/uL (ref 150–400)
RBC: 4.14 MIL/uL (ref 3.87–5.11)
RDW: 13.2 % (ref 11.5–15.5)
WBC: 10.4 10*3/uL (ref 4.0–10.5)
nRBC: 0 % (ref 0.0–0.2)

## 2021-09-06 NOTE — ED Triage Notes (Signed)
Pt is [redacted] weeks pregnant  pt reports vaginal bleeding.  Bleeding began tonight.  Pt had similar episode 2 weeks ago.  Pt has abd pain with cramping  pt alert  interpreter on a stick used in triage.

## 2021-09-07 ENCOUNTER — Emergency Department: Payer: Medicaid Other

## 2021-09-07 ENCOUNTER — Other Ambulatory Visit: Payer: Self-pay

## 2021-09-07 DIAGNOSIS — Z2839 Other underimmunization status: Secondary | ICD-10-CM

## 2021-09-07 DIAGNOSIS — O09899 Supervision of other high risk pregnancies, unspecified trimester: Secondary | ICD-10-CM

## 2021-09-07 DIAGNOSIS — O0990 Supervision of high risk pregnancy, unspecified, unspecified trimester: Secondary | ICD-10-CM

## 2021-09-07 DIAGNOSIS — O0992 Supervision of high risk pregnancy, unspecified, second trimester: Secondary | ICD-10-CM

## 2021-09-07 LAB — HCG, QUANTITATIVE, PREGNANCY: hCG, Beta Chain, Quant, S: 26770 m[IU]/mL — ABNORMAL HIGH (ref <5)

## 2021-09-07 MED ORDER — HYDROXYPROGESTERONE CAPROATE 275 MG/1.1ML ~~LOC~~ SOAJ
275.0000 mg | SUBCUTANEOUS | Status: AC
  Administered 2021-09-07 – 2021-12-19 (×16): 275 mg via SUBCUTANEOUS

## 2021-09-07 NOTE — ED Provider Notes (Signed)
[facility] Provider Note    Event Date/Time   First MD Initiated Contact with Patient 09/06/21 2359     (approximate)   History   Abdominal Pain   HPI  Gabrielle Romero is a 31 y.o. female currently [redacted] weeks gestational age presents for evaluation of vaginal bleeding.  Patient reports small amount of vaginal bleeding that started this evening.  She has tablets to care for this pregnancy.  She has been taking her prenatal vitamins.  She has not felt the baby move yet.  She reports 2 prior miscarriages in the past and 2 live children.  She is complaining of mild intermittent suprapubic cramping associated with it.     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 2005 or 2006   due to accident; no vision right eye   SPINE SURGERY  ???2017   states surgery on spine due to a disc problem     Physical Exam   Triage Vital Signs: ED Triage Vitals  Enc Vitals Group     BP 09/06/21 2303 128/75     Pulse Rate 09/06/21 2303 80     Resp 09/06/21 2303 20     Temp 09/06/21 2303 98.1 F (36.7 C)     Temp src --      SpO2 09/06/21 2303 99 %     Weight 09/06/21 2255 170 lb (77.1 kg)     Height 09/06/21 2255 5\' 4"  (1.626 m)     Head Circumference --      Peak Flow --      Pain Score 09/06/21 2255 5     Pain Loc --      Pain Edu? --      Excl. in [place]? --     Most recent vital signs: Vitals:    09/06/21 2303  BP: 128/75  Pulse: 80  Resp: 20  Temp: 98.1 F (36.7 C)  SpO2: 99%     Constitutional: Alert and oriented. Well appearing and in no apparent distress. HEENT:      Head: Normocephalic and atraumatic.         Eyes: Conjunctivae are normal. Sclera is non-icteric.       Mouth/Throat: Mucous membranes are moist.       Neck: Supple with no signs of meningismus. Cardiovascular: Regular rate and rhythm. No murmurs, gallops, or rubs. 2+ symmetrical distal pulses are present in all extremities.  Respiratory: Normal respiratory effort. Lungs are clear to auscultation bilaterally.  Gastrointestinal: Soft, non tender, and non distended with positive bowel sounds. No rebound or guarding. Genitourinary: No CVA tenderness. Musculoskeletal:  No edema, cyanosis, or erythema of extremities. Neurologic: Normal speech and language. Face is symmetric. Moving all extremities. No gross focal neurologic deficits are appreciated. Skin: Skin is warm, dry  and intact. No rash noted. Psychiatric: Mood and affect are normal. Speech and behavior are normal.  ED Results / Procedures / Treatments   Labs (all labs ordered are listed, but only abnormal results are displayed) Labs Reviewed  COMPREHENSIVE METABOLIC PANEL - Abnormal; Notable for the following components:      Result Value   CO2 20 (*)    Glucose, Bld 125 (*)    Creatinine, Ser 0.32 (*)    Calcium 8.6 (*)    Albumin 3.2 (*)    All other components within normal limits  CBC - Abnormal; Notable for the following components:   Hemoglobin 11.7 (*)    HCT 34.9 (*)    All other components within normal limits  URINALYSIS, ROUTINE W REFLEX MICROSCOPIC - Abnormal; Notable for the following components:   Color, Urine YELLOW (*)    APPearance CLOUDY (*)    All other components within normal limits  HCG, QUANTITATIVE, PREGNANCY - Abnormal; Notable for the following components:   hCG, Beta Chain, Quant, S 26,770 (*)    All other  components within normal limits     EKG  none   RADIOLOGY I, Rudene Re, attending MD, have personally viewed and interpreted the images obtained during this visit as below:  Ultrasound showing viable IUP with normal heart rate   ___________________________________________________ Interpretation by Radiologist:  US OB Limited  Result Date: 09/07/2021 CLINICAL DATA:  Pregnant, vaginal bleeding EXAM: LIMITED OBSTETRIC ULTRASOUND COMPARISON:  08/24/2021, MRI 07/28/2021 FINDINGS: Number of Fetuses: 1 Heart Rate:  140 bpm Movement: Yes Presentation: Breech Placental Location: Posterior Previa: No Amniotic Fluid (Subjective):  Within normal limits. AFI:   Not specifically measured BPD: 3.5 cm 16 w  6 d MATERNAL FINDINGS: Cervix:  Appears closed. Uterus/Adnexae: There is interval development of a small subchorionic hemorrhage involving the anterior lower uterine segment in close proximity to the internal cervical os. The maternal ovaries are unremarkable. No free fluid within the pelvis. IMPRESSION: Single living intrauterine gestation with appropriate interval growth since prior examination of 08/24/2021. Estimated gestational age [redacted] weeks, 6 days. Interval development of a small subchorionic hemorrhage within the anterior lower uterine segment. This exam is performed on an emergent basis and does not comprehensively evaluate fetal size, dating, or anatomy; follow-up complete OB US should be considered if further fetal assessment is warranted. Electronically Signed   By: Fidela Salisbury M.D.   On: 09/07/2021 02:01      PROCEDURES:  Critical Care performed: No  Procedures    IMPRESSION / MDM / ASSESSMENT AND PLAN / ED COURSE  I reviewed the triage vital signs and the nursing notes.  31 y.o. female currently [redacted] weeks gestational age presents for evaluation of vaginal bleeding.  Patient is well-appearing, abdomen soft and nontender, hemodynamically stable.  Ddx: Subchorionic  hemorrhage versus threatened miscarriage   Plan: [place], CBC, hCG, metabolic panel   MEDICATIONS GIVEN IN ED: Medications - No data to display   ED COURSE: Ultrasound showing a small subchorionic hemorrhage with a viable IUP with normal heart rate.  No signs of acute blood loss anemia with normal vitals and a hemoglobin of 11.7.  No significant electrolyte derangements.  Patient's blood type a positive and no indication for RhoGAM.  She has an appointment with her OB in the morning.  Admission was considered but with no heavy bleeding and an appointment with her OB in less than 12 hours I felt was safe for patient to be discharged home.  We discussed  pelvic rest and signs of acute blood loss anemia and recommended that she return to the emergency room if these develop.  Otherwise follow-up with her doctor this morning.   Consults: None   EMR reviewed including last visit with her OB from 2 days ago    FINAL CLINICAL IMPRESSION(S) / ED DIAGNOSES   Final diagnoses:  Vaginal bleeding before [redacted] weeks gestation  Threatened miscarriage  Subchorionic hemorrhage of placenta in second trimester, single or unspecified fetus     Rx / DC Orders   ED Discharge Orders     None        Note:  This document was prepared using Dragon voice recognition software and may include unintentional dictation errors.   Please note:  Patient was evaluated in Emergency Department today for the symptoms described in the history of present illness. Patient was evaluated in the context of the global COVID-19 pandemic, which necessitated consideration that the patient might be at risk for infection with the SARS-CoV-2 virus that causes COVID-19. Institutional protocols and algorithms that pertain to the evaluation of patients at risk for COVID-19 are in a state of rapid change based on information released by regulatory bodies including the CDC and federal and state organizations. These policies and algorithms  were followed during the patient's care in the ED.  Some ED evaluations and interventions may be delayed as a result of limited staffing during the pandemic.       Alfred Levins, [place], MD 09/07/21 [id number]

## 2021-09-07 NOTE — Progress Notes (Signed)
Patient reports to clinic today for her 17P injection due to prior history of preterm deliveries and miscarriages.  Patient is currently [redacted]w[redacted]d.  Patient reported to the ED on 09/06/22 for vaginal bleeding along with cramping.  ED report showed single living intrauterine gestation with appropriate interval growth and small subchorionic hemorrhage with a viable IUP with normal heart rate.  Patient reports that cramping has subsided with a teaspoon amount of brownish blood noted.  FHR 140 today.  Reports no other signs and symptoms today.  Reviewed signs of preterm labor and advised patient that if bleeding and cramping worsen to follow up at ED.  17P given today.  Patient to return to clinic in 1 week for follow-up along with 17P.  Referral for anatomy scan at 18 weeks submitted. Gregary Cromer, FNP

## 2021-09-11 ENCOUNTER — Telehealth: Payer: Self-pay

## 2021-09-11 NOTE — Telephone Encounter (Signed)
Received fax from [facility] in [place] stating that no information is available for this patient by name and demographics given. Also states "not an [facility] patient." No records available from above facility. Faxed response sent to clerical for scanning. Hal Morales, RN

## 2021-09-14 ENCOUNTER — Encounter: Payer: Self-pay | Admitting: Advanced Practice Midwife

## 2021-09-14 ENCOUNTER — Ambulatory Visit: Payer: Self-pay | Admitting: Advanced Practice Midwife

## 2021-09-14 ENCOUNTER — Other Ambulatory Visit: Payer: Self-pay

## 2021-09-14 DIAGNOSIS — Z98891 History of uterine scar from previous surgery: Secondary | ICD-10-CM

## 2021-09-14 DIAGNOSIS — O09899 Supervision of other high risk pregnancies, unspecified trimester: Secondary | ICD-10-CM

## 2021-09-14 DIAGNOSIS — Z2839 Other underimmunization status: Secondary | ICD-10-CM

## 2021-09-14 DIAGNOSIS — O0990 Supervision of high risk pregnancy, unspecified, unspecified trimester: Secondary | ICD-10-CM

## 2021-09-14 NOTE — Progress Notes (Signed)
[facility] Department ?Maternal Health Clinic ? ?PRENATAL VISIT NOTE ? ?Subjective:  ?Gabrielle Romero is a 31 y.o. [phone] at [redacted]w[redacted]d being seen today for ongoing prenatal care.  She is currently monitored for the following issues for this high-risk pregnancy and has Abdominal pain; Cholelithiasis; Transaminitis; Biliary colic; Elevated LFTs; Vaginal bleeding affecting early pregnancy; Positive D dimer; Preterm delivery in [place] 15 years ago PPROM at 29 wks per pt and lived x 32 hrs; Supervision of high risk pregnancy, antepartum; History of C-section x 3; Neonatal death; and Maternal varicella, non-immune on their problem list. ? ?Patient reports  went to ER 09/06/21 for small amt bleeding.  .  Contractions: Not present. Vag. Bleeding: None.  Movement: Absent. Denies leaking of fluid/ROM.  ? ?The following portions of the patient's history were reviewed and updated as appropriate: allergies, current medications, past family history, past medical history, past social history, past surgical history and problem list. Problem list updated. ? ?Objective:  ?There were no vitals filed for this visit. ? ?Fetal Status: Fetal Heart Rate (bpm): 160 Fundal Height: 18 cm Movement: Absent    ? ?General:  Alert, oriented and cooperative. Patient is in no acute distress.  ?Skin: Skin is warm and dry. No rash noted.   ?Cardiovascular: Normal heart rate noted  ?Respiratory: Normal respiratory effort, no problems with respiration noted  ?Abdomen: Soft, gravid, appropriate for gestational age.  Pain/Pressure: Absent     ?Pelvic: Cervical exam deferred        ?Extremities: Normal range of motion.  Edema: None  ?Mental Status: Normal mood and affect. Normal behavior. Normal judgment and thought content.  ? ?Assessment and Plan:  ?Pregnancy: [id number] at [redacted]w[redacted]d ? ?1. Maternal varicella, non-immune ? ? ?2. Supervision of high risk pregnancy, antepartum ?Went to ER 09/06/21 for small amt bleeding. U/s that day with AFI wnl,  posterior placenta, small subchorionic hemorrhage in lower uterine segment close to int os at 16 6/7 ?Has f/u u/s 10/09/21 ?Declines [place] screen ?Not walking due to bleeding which has resolved ?6 lb 9.6 oz (2.994 kg) ? ? ?3. History of C-section x 3 ?Needs [place] delivery plans apt for repeat c/s ? ?4. Preterm delivery in [place] 15 years ago PPROM at 31 wks per pt and lived x 32 hrs ?Received 17-P today ? ? ?Preterm labor symptoms and general obstetric precautions including but not limited to vaginal bleeding, contractions, leaking of fluid and fetal movement were reviewed in detail with the patient. ?Please refer to After Visit Summary for other counseling recommendations.  ?No follow-ups on file. ? ?Future Appointments  ?Date Time Provider [place]  ?09/21/2021  1:00 PM [facility] NURSE [facility] None  ?10/09/2021  3:00 PM [facility]  ?10/09/2021  4:00 PM [facility] CONSULT RM [facility] None  ? ? ?Herbie Saxon, CNM ?

## 2021-09-14 NOTE — Progress Notes (Signed)
Patient here for MH RV at 17w 6d ? ?Patient declined QUAD screen and declination form signed.  ? ?17P (Makena) given at R arm and tolerated well.  ? ?Floy Sabina, RN ? ?

## 2021-09-21 ENCOUNTER — Other Ambulatory Visit: Payer: Self-pay

## 2021-09-21 NOTE — Progress Notes (Signed)
In Nurse Clinic for 17 P. Reports abdomen gets "hard" usually twice a day after standing too long or after walking for long time.  ? ?Consult A White, FNP who recommends for RN to counsel pt that when this occurs, she should lay down on left side and drink 3 glasses of water. If "hardness"/pain do not subside, she should go to ER for evaluation. OK to proceed with 17 P today. ? ?17 P given R arm without prob.  ? ?RN discussed provider recommendations with pt. Questions answered and reports understanding.  ? ?Next 17 P scheduled 09/28/2021 at 2 pm, has reminder.  ?Next MHC RV scheduled 10/03/2021, pt aware. M [place], interpreter.Jerel Shepherd, RN ? ?

## 2021-09-27 LAB — WET PREP FOR TRICH, YEAST, CLUE
Trichomonas Exam: NEGATIVE
Yeast Exam: NEGATIVE

## 2021-09-28 ENCOUNTER — Other Ambulatory Visit: Payer: Self-pay

## 2021-09-28 DIAGNOSIS — O0992 Supervision of high risk pregnancy, unspecified, second trimester: Secondary | ICD-10-CM

## 2021-09-28 DIAGNOSIS — O0990 Supervision of high risk pregnancy, unspecified, unspecified trimester: Secondary | ICD-10-CM

## 2021-09-28 NOTE — Progress Notes (Signed)
Presents to Nurse Clinic for 17P injection. Denies signs of premature labor. Tolerated injection with minimal complaints of discomfort. Per client, has changed delivery site to [facility]. As MHC RV appt 10/03/2021, counseled to discuss if [facility] [place] appt on 10/09/2021 needs to be cancelled and re-ordered at [facility] (may push appt out to later date). Client states will do so and note placed on pink sticky. Jossie Ng, RN ? ?

## 2021-10-03 ENCOUNTER — Other Ambulatory Visit: Payer: Self-pay

## 2021-10-03 ENCOUNTER — Ambulatory Visit: Payer: Self-pay | Admitting: Nurse Practitioner

## 2021-10-03 ENCOUNTER — Encounter: Payer: Self-pay | Admitting: Nurse Practitioner

## 2021-10-03 DIAGNOSIS — O0990 Supervision of high risk pregnancy, unspecified, unspecified trimester: Secondary | ICD-10-CM

## 2021-10-03 DIAGNOSIS — O0992 Supervision of high risk pregnancy, unspecified, second trimester: Secondary | ICD-10-CM

## 2021-10-03 NOTE — Progress Notes (Signed)
Consulted with Dr. Alvester Morin on giving 17P injection 2 days early regarding last 17P dose administered 09/28/21 (5 days ago.) Per Dr. Alvester Morin ok to give 17P today. 17P administered in right arm and tolerated well. Next scheduled 17P appt will be in one week. Tawny Hopping, RN ?  ?

## 2021-10-03 NOTE — Progress Notes (Signed)
Here today for 20.4 week MH RV. Taking PNV QD. Received last 17P injection 09/28/20. Aware of and plans to keep scheduled [facility] [place] [facility] appt on 10/09/21. Denies ED/hospital visits since last RV. Tawny Hopping, RN ? ?

## 2021-10-03 NOTE — Progress Notes (Signed)
[facility] Department ?Maternal Health Clinic ? ?PRENATAL VISIT NOTE ? ?Subjective:  ?Gabrielle Romero is a 31 y.o. [phone] at [redacted]w[redacted]d being seen today for ongoing prenatal care.  She is currently monitored for the following issues for this high-risk pregnancy and has Abdominal pain; Cholelithiasis; Transaminitis; Biliary colic; Elevated LFTs; Vaginal bleeding affecting early pregnancy; Positive D dimer; Preterm delivery in [place] 15 years ago PPROM at 29 wks per pt and lived x 32 hrs; Supervision of high risk pregnancy, antepartum; History of C-section x 3; Neonatal death; and Maternal varicella, non-immune on their problem list. ? ?Patient reports no complaints.  Contractions: Not present. Vag. Bleeding: None.  Movement: Absent. Denies leaking of fluid/ROM.  ? ?The following portions of the patient's history were reviewed and updated as appropriate: allergies, current medications, past family history, past medical history, past social history, past surgical history and problem list. Problem list updated. ? ?Objective:  ? ?Vitals:  ? 10/03/21 1012  ?BP: 112/63  ?Pulse: 81  ?Temp: 99.2 ?F (37.3 ?C)  ?Weight: 176 lb (79.8 kg)  ? ? ?Fetal Status: Fetal Heart Rate (bpm): 145 Fundal Height: 20 cm Movement: Absent    ? ?General:  Alert, oriented and cooperative. Patient is in no acute distress.  ?Skin: Skin is warm and dry. No rash noted.   ?Cardiovascular: Normal heart rate noted  ?Respiratory: Normal respiratory effort, no problems with respiration noted  ?Abdomen: Soft, gravid, appropriate for gestational age.  Pain/Pressure: Absent     ?Pelvic: Cervical exam deferred        ?Extremities: Normal range of motion.  Edema: None  ?Mental Status: Normal mood and affect. Normal behavior. Normal judgment and thought content.  ? ?Assessment and Plan:  ?Pregnancy: [id number] at [redacted]w[redacted]d ? ?1. Supervision of high risk pregnancy, antepartum ?-31 year old female in clinic today for prenatal care.   ?-Patient states she  is taking her PNV daily.  ?-Patient aware of U/S appointment on 10/09/21, will go to appointment at [facility] and then all other care will be transferred to [facility].  ?-12 lb (5.443 kg)  ? ?2. Preterm delivery in [place] 15 years ago PPROM at 28 wks per pt and lived x 32 hrs ?-Denies any signs and symptoms of preterm labor. ?-17P given today. ? ?Due to a language barrier an interpreter (language line [phone]) was used for the provider portion of the visit.    ? ?Term labor symptoms and general obstetric precautions including but not limited to vaginal bleeding, contractions, leaking of fluid and fetal movement were reviewed in detail with the patient. ?Please refer to After Visit Summary for other counseling recommendations.  ? ?Return in about 4 weeks (around 10/31/2021) for Routine prenatal care visit. ? ?Future Appointments  ?Date Time Provider [place]  ?10/09/2021  3:00 PM [facility]  ?10/09/2021  4:00 PM [facility] CONSULT RM [facility] None  ?10/10/2021 10:00 AM [facility] NURSE AC-MAT None  ?10/17/2021 10:00 AM [facility] PROVIDER AC-MAT None  ? ? ?Gregary Cromer, FNP ? ?

## 2021-10-09 ENCOUNTER — Ambulatory Visit: Payer: Self-pay | Attending: Obstetrics and Gynecology

## 2021-10-09 ENCOUNTER — Ambulatory Visit (HOSPITAL_BASED_OUTPATIENT_CLINIC_OR_DEPARTMENT_OTHER): Payer: Self-pay | Admitting: Obstetrics and Gynecology

## 2021-10-09 ENCOUNTER — Other Ambulatory Visit: Payer: Self-pay

## 2021-10-09 ENCOUNTER — Other Ambulatory Visit: Payer: Self-pay | Admitting: Nurse Practitioner

## 2021-10-09 VITALS — BP 119/65 | HR 86 | Temp 98.4°F | Ht 65.0 in | Wt 178.5 lb

## 2021-10-09 DIAGNOSIS — O34219 Maternal care for unspecified type scar from previous cesarean delivery: Secondary | ICD-10-CM | POA: Insufficient documentation

## 2021-10-09 DIAGNOSIS — O0942 Supervision of pregnancy with grand multiparity, second trimester: Secondary | ICD-10-CM | POA: Insufficient documentation

## 2021-10-09 DIAGNOSIS — Z363 Encounter for antenatal screening for malformations: Secondary | ICD-10-CM | POA: Insufficient documentation

## 2021-10-09 DIAGNOSIS — O09292 Supervision of pregnancy with other poor reproductive or obstetric history, second trimester: Secondary | ICD-10-CM | POA: Insufficient documentation

## 2021-10-09 DIAGNOSIS — Z3A21 21 weeks gestation of pregnancy: Secondary | ICD-10-CM | POA: Insufficient documentation

## 2021-10-09 DIAGNOSIS — O0990 Supervision of high risk pregnancy, unspecified, unspecified trimester: Secondary | ICD-10-CM

## 2021-10-09 DIAGNOSIS — O99012 Anemia complicating pregnancy, second trimester: Secondary | ICD-10-CM | POA: Insufficient documentation

## 2021-10-09 DIAGNOSIS — D649 Anemia, unspecified: Secondary | ICD-10-CM | POA: Insufficient documentation

## 2021-10-09 DIAGNOSIS — O09892 Supervision of other high risk pregnancies, second trimester: Secondary | ICD-10-CM

## 2021-10-09 DIAGNOSIS — N858 Other specified noninflammatory disorders of uterus: Secondary | ICD-10-CM | POA: Insufficient documentation

## 2021-10-09 DIAGNOSIS — O09212 Supervision of pregnancy with history of pre-term labor, second trimester: Secondary | ICD-10-CM | POA: Insufficient documentation

## 2021-10-09 NOTE — Progress Notes (Signed)
Maternal-Fetal Medicine  ? ?Name: Kattie Pink ?DOB: November 10, 1990 ?MRN: [id number] ?Referring Provider: Donnal Moat, CNM  ? ?I had the pleasure of seeing Ms. Sidnie Selke today at [facility]  She is G5 P2112 at 21w 3d gestation and is here for fetal anatomy scan and consultation. ? ?Obstetric history ?2008: Preterm cesarean delivery in [place] at [redacted] weeks gestation of a female infant.  Unfortunately, the infant died after birth.  Patient had rupture of membranes before delivery. ?2011: Term cesarean delivery in the [place] of a female infant weighing 3033 g at birth.  Her son is in good health. ?2013: Term cesarean delivery of a female infant weighing 3572 g at birth.  Her daughter is in good health. ?Patient took 17-OH progesterone injections in both pregnancies. ?In May 2022, she had an early spontaneous miscarriage at [redacted] weeks gestation. ?GYN history: History of abnormal Pap smear once that later resolved.  No history of cervical surgeries. ?Past medical history: No history of diabetes or hypertension or any other chronic medical conditions.  Patient has gallstones diagnosed MRI abdomen.  She does not have abdominal pain now. ?Past surgical history: Cesarean section, spinal surgery (disc), eye surgery. ?Medications: Prenatal vitamins, 17-OH progesterone injections. ?Allergies: No known drug allergies. ?Social history: Denies tobacco or drug or alcohol use.  Her partner is in good health, and he is not the father of her previous children. ?Prenatal: Patient had opted not to screen for fetal aneuploidies. ? ?Ultrasound ?We performed a fetal anatomical survey.  Fetal biometry is consistent with the previously established dates.  Amniotic fluid is normal and good fetal activity seen.  No markers of aneuploidies or fetal structural defects are seen. ?On transabdominal scan, the cervix looks long and closed.  Placenta is posterior and there is no evidence of previa or placenta accreta  spectrum. ? ?History of preterm delivery ?This is the single most-common cause of recurrent preterm birth. I informed her that studies have shown the beneficial effect of progesterone therapy in reducing the incidence of preterm births in women with history of preterm birth. Women with preterm births before 34 weeks benefited more from progesterone treatment. However, recent trial (PROLONG trial) has shown no reduction in preterm delivery rates from 17-OH progesterone. Based on SMFM recommendations, we still offer progesterone to high-risk pregnant women. ?Progesterone is not associated with an increased adverse outcome in pregnancy or in the infants. A slight increase in gestational diabetes is seen in one study. Local side effects including itching and redness can occur.  ? ?Previous cesarean deliveries ?Patient had 3 previous cesarean deliveries.  I have reassured her of normal placental position.  The likelihood of placenta accreta spectrum in the absence of placenta previa he is about 0.8%.  I explained that repeat cesarean deliveries increase the likelihood of placenta previa and/or placenta accreta spectrum.  The long-term complication of cesarean section include small bowel obstruction. ?Her recent hemoglobin is 11.7 d/DL and hematocrit 34.9.  It is desirable to keep the hematocrit close to 40 at delivery. ?Patient has a vertical skin scar but does not know the type of incision at her first delivery. I recommend we obtain the operative note of her most-recent cesarean delivery. If it was performed at 39 weeks' gestation, we will assume the likelihood of uterine scar rupture unknown scar at first cesarean to be very low. If she had repeat elective cesarean delivery at 37 weeks' gestation, I recommend we schedule her cesarean at 74 weeks' gestation. ? ?Pregnancy  Dating ?Patient is very sure of her LMP date. I reviewed the images of transvaginal ultrasound performed at 10 weeks' gestation, which is more  accurate. It differs from her LMP-established gestational age by only 6 days and, therefore, I have not amended her EDD. ? ?Recommendations ?-An appointment was made for her to return in 6 weeks for fetal growth assessment and completion of cardiac anatomy (LVOT and RVOT). ?-Anemia to be corrected and aim to keep the hematocrit close to 40 at delivery. ?-Blood should be available at short notice at delivery. ?-Cesarean delivery at [redacted] weeks gestation. ?-Continue progesterone injections till [redacted] weeks gestation. ?-Operative note from previous cesarean delivery ([name]) to be obtained and reviewed for planning timing of delivery. ? ?Thank you for consultation.  If you have any questions or concerns, please contact me the [facility].  Consultation including face-to-face counseling (more than 50% of time spent) is 45 minutes. ? ? ? ? ?

## 2021-10-09 NOTE — Progress Notes (Unsigned)
Maternal-Fetal Medicine  ? ?Name: Gabrielle Romero ?DOB: 10/09/1990 ?MRN: [id number] ?Referring Provider: Elizabeth Sciora, CNM  ? ?I had the pleasure of seeing Gabrielle Romero today at [facility].  She is G5 P2112 at 21w 3d gestation and is here for fetal anatomy scan and consultation. ? ?Obstetric history ?2008: Preterm cesarean delivery in [place] at [redacted] weeks gestation of a female infant.  Unfortunately, the infant died after birth.  Patient had rupture of membranes before delivery. ?2011: Term cesarean delivery in the [place] of a female infant weighing 3033 g at birth.  Her son is in good health. ?2013: Term cesarean delivery of a female infant weighing 3572 g at birth.  Her daughter is in good health. ?Patient took 17-OH progesterone injections in both pregnancies. ?In May 2022, she had an early spontaneous miscarriage at [redacted] weeks gestation. ?GYN history: History of abnormal Pap smear once that later resolved.  No history of cervical surgeries. ?Past medical history: No history of diabetes or hypertension or any other chronic medical conditions.  Patient has gallstones diagnosed MRI abdomen.  She does not have abdominal pain now. ?Past surgical history: Cesarean section, spinal surgery (disc), eye surgery. ?Medications: Prenatal vitamins, 17-OH progesterone injections. ?Allergies: No known drug allergies. ?Social history: Denies tobacco or drug or alcohol use.  Her partner is in good health, and he is not the father of her previous children. ?Prenatal: Patient had opted not to screen for fetal aneuploidies. ? ?Ultrasound ?We performed a fetal anatomical survey.  Fetal biometry is consistent with the previously established dates.  Amniotic fluid is normal and good fetal activity seen.  No markers of aneuploidies or fetal structural defects are seen. ?On transabdominal scan, the cervix looks long and closed.  Placenta is posterior and there is no evidence of previa or placenta accreta  spectrum. ? ?History of preterm delivery ?This is the single most-common cause of recurrent preterm birth. I informed her that studies have shown the beneficial effect of progesterone therapy in reducing the incidence of preterm births in women with history of preterm birth. Women with preterm births before 34 weeks benefited more from progesterone treatment. However, recent trial (PROLONG trial) has shown no reduction in preterm delivery rates from 17-OH progesterone. Based on SMFM recommendations, we still offer progesterone to high-risk pregnant women. ?Progesterone is not associated with an increased adverse outcome in pregnancy or in the infants. A slight increase in gestational diabetes is seen in one study. Local side effects including itching and redness can occur.  ? ?Previous cesarean deliveries ?Patient had 3 previous cesarean deliveries.  I have reassured her of normal placental position.  The likelihood of placenta accreta spectrum in the absence of placenta previa he is about 0.8%.  I explained that repeat cesarean deliveries increase the likelihood of placenta previa and/or placenta accreta spectrum.  The long-term complication of cesarean section include small bowel obstruction. ?Her recent hemoglobin is 11.7 d/DL and hematocrit 34.9.  It is desirable to keep the hematocrit close to 40 at delivery. ?Patient has a vertical skin scar but does not know the type of incision at her first delivery. I recommend we obtain the operative note of her most-recent cesarean delivery. If it was performed at 39 weeks' gestation, we will assume the likelihood of uterine scar rupture unknown scar at first cesarean to be very low. If she had repeat elective cesarean delivery at 37 weeks' gestation, I recommend we schedule her cesarean at 37 weeks' gestation. ? ?Pregnancy   weeks' gestation. ? ?Pregnancy Dating ?Patient is very sure of her LMP date. I reviewed the images of transvaginal ultrasound performed at 10 weeks' gestation, which is more  accurate. It differs from her LMP-established gestational age by only 6 days and, therefore, I have not amended her EDD. ? ?Recommendations ?-An appointment was made for her to return in 6 weeks for fetal growth assessment and completion of cardiac anatomy (LVOT and RVOT). ?-Anemia to be corrected and aim to keep the hematocrit close to 40 at delivery. ?-Blood should be available at short notice at delivery. ?-Cesarean delivery at [redacted] weeks gestation. ?-Continue progesterone injections till [redacted] weeks gestation. ?-Operative note from previous cesarean delivery ([name]) to be obtained and reviewed for planning timing of delivery. ? ?Thank you for consultation.  If you have any questions or concerns, please contact me the [facility].  Consultation including face-to-face counseling (more than 50% of time spent) is 45 minutes. ? ? ? ? ?

## 2021-10-10 ENCOUNTER — Other Ambulatory Visit: Payer: Self-pay

## 2021-10-10 DIAGNOSIS — O0990 Supervision of high risk pregnancy, unspecified, unspecified trimester: Secondary | ICD-10-CM

## 2021-10-10 DIAGNOSIS — O0992 Supervision of high risk pregnancy, unspecified, second trimester: Secondary | ICD-10-CM

## 2021-10-10 NOTE — Progress Notes (Signed)
17-P given today, left arm, tolerated well, aware of MH RV 10/17/21.Marland KitchenBurt Knack, RN  ?

## 2021-10-17 ENCOUNTER — Ambulatory Visit: Payer: Self-pay | Admitting: Family Medicine

## 2021-10-17 VITALS — BP 108/62 | HR 92 | Temp 98.2°F | Wt 179.0 lb

## 2021-10-17 DIAGNOSIS — Z98891 History of uterine scar from previous surgery: Secondary | ICD-10-CM

## 2021-10-17 DIAGNOSIS — O0992 Supervision of high risk pregnancy, unspecified, second trimester: Secondary | ICD-10-CM

## 2021-10-17 DIAGNOSIS — O0990 Supervision of high risk pregnancy, unspecified, unspecified trimester: Secondary | ICD-10-CM

## 2021-10-17 LAB — WET PREP FOR TRICH, YEAST, CLUE
Trichomonas Exam: NEGATIVE
Yeast Exam: NEGATIVE

## 2021-10-17 NOTE — Progress Notes (Addendum)
Here today for 22.4 week MH RV and 17P injection. Taking PNV QD. Denies ED/hospital visits since last RV. 17P injection given in right upper arm and tolerated well. Tawny Hopping, RN ? ? ?

## 2021-10-20 NOTE — Progress Notes (Signed)
[facility] Department ?Maternal Health Clinic ? ?PRENATAL VISIT NOTE ? ?Subjective:  ?Gabrielle Romero is a 31 y.o. [phone] at [redacted]w[redacted]d being seen today for ongoing prenatal care.  She is currently monitored for the following issues for this high-risk pregnancy and has Abdominal pain; Cholelithiasis; Transaminitis; Biliary colic; Elevated LFTs; Vaginal bleeding affecting early pregnancy; Positive D dimer; Preterm delivery in [place] 15 years ago PPROM at 29 wks per pt and lived x 32 hrs; Supervision of high risk pregnancy, antepartum; History of C-section x 3; Neonatal death; and Maternal varicella, non-immune on their problem list. ? ?Patient reports no complaints.  Contractions: Not present. Vag. Bleeding: None.  Movement: Present. Denies leaking of fluid/ROM.  ? ?The following portions of the patient's history were reviewed and updated as appropriate: allergies, current medications, past family history, past medical history, past social history, past surgical history and problem list. Problem list updated. ? ?Objective:  ? ?Vitals:  ? 10/17/21 1011  ?BP: 108/62  ?Pulse: 92  ?Temp: 98.2 ?F (36.8 ?C)  ?Weight: 179 lb (81.2 kg)  ? ? ?Fetal Status: Fetal Heart Rate (bpm): 141 Fundal Height: 22 cm Movement: Present    ? ?General:  Alert, oriented and cooperative. Patient is in no acute distress.  ?Skin: Skin is warm and dry. No rash noted.   ?Cardiovascular: Normal heart rate noted  ?Respiratory: Normal respiratory effort, no problems with respiration noted  ?Abdomen: Soft, gravid, appropriate for gestational age.  Pain/Pressure: Absent     ?Pelvic: Cervical exam deferred        ?Extremities: Normal range of motion.  Edema: None  ?Mental Status: Normal mood and affect. Normal behavior. Normal judgment and thought content.  ? ?Assessment and Plan:  ?Pregnancy: [id number] at [redacted]w[redacted]d ? ?1. Supervision of high risk pregnancy, antepartum ?-pt reports going well ?-TWG 15 lbs  ?-taking PNV as directed  ?-Reviewed  3/28 anatomy scan, f/u scheduled for 4 weeks.  ?- pt unsure about BCM PP  ? ?- WET PREP FOR TRICH, YEAST, CLUE ? ?2. Preterm delivery in [place] 15 years ago PPROM at 74 wks per pt and lived x 32 hrs ?17 given today, see RN note  ? ?3. History of C-section x 3 ?- MFM recommends Op note from most recent cesarean.   ?-ROI sent 3 times to medical system in [place] that patient gave RN and that is listed on child's birth record.  Received return information that patient was not seen at this facility.  Pt informed and that she needs to contact this health system for clarity and attempt to get records.  ?MFM recommends repeat cesarean at 37 weeks  ? ? ?Preterm labor symptoms and general obstetric precautions including but not limited to vaginal bleeding, contractions, leaking of fluid and fetal movement were reviewed in detail with the patient. ?Please refer to After Visit Summary for other counseling recommendations.  ?Return in about 1 week (around 10/24/2021) for Needs 17P one week appt and four week MH RV appt.. ? ?Future Appointments  ?Date Time Provider [place]  ?10/24/2021 11:00 AM [facility]-MH PROVIDER AC-MAT None  ?11/06/2021 10:00 AM [facility]  ?11/14/2021 11:00 AM [facility] PROVIDER AC-MAT None  ? ?[facility] agency interpreter used for Spanish interpretation.     ?Junious Dresser, FNP ? ?

## 2021-10-24 ENCOUNTER — Ambulatory Visit: Payer: Self-pay | Admitting: Advanced Practice Midwife

## 2021-10-24 DIAGNOSIS — O0992 Supervision of high risk pregnancy, unspecified, second trimester: Secondary | ICD-10-CM

## 2021-10-24 DIAGNOSIS — O0990 Supervision of high risk pregnancy, unspecified, unspecified trimester: Secondary | ICD-10-CM

## 2021-10-24 NOTE — Progress Notes (Addendum)
Here  for nurse visit only. 17 P injection given along with PTL handout with explanation and [facility] contact card. [facility] Spanish Interpretor utilized. Delynn Flavin RN ?

## 2021-10-30 NOTE — Progress Notes (Signed)
4 makena 17-P auto injectors received today and placed in patient drawer.Burt Knack, RN  ?

## 2021-10-31 ENCOUNTER — Other Ambulatory Visit: Payer: Self-pay

## 2021-10-31 NOTE — Progress Notes (Signed)
In nurse clinic for 17 P. Pt reports contractions and pain that went away within hour on Monday 10/29/21. Reviewed with pt steps to take if preterm labor signs and Info sheet for Preterm Labor given and explained. Pt verbalizes understanding.  ? ?17 P administered today R arm without problem. Order by Onalee Hua, FNP dated 09/07/2021.  ? ?Next 17 P appt 11/07/2021 at 1:20 pm, has reminder. M [place], interpreter. Jerel Shepherd, RN ? ?

## 2021-11-06 ENCOUNTER — Ambulatory Visit: Payer: Self-pay | Attending: Maternal & Fetal Medicine

## 2021-11-06 ENCOUNTER — Other Ambulatory Visit: Payer: Self-pay

## 2021-11-06 VITALS — BP 126/71 | HR 94 | Temp 98.0°F | Ht 65.0 in | Wt 182.5 lb

## 2021-11-06 DIAGNOSIS — O34219 Maternal care for unspecified type scar from previous cesarean delivery: Secondary | ICD-10-CM

## 2021-11-06 DIAGNOSIS — O0990 Supervision of high risk pregnancy, unspecified, unspecified trimester: Secondary | ICD-10-CM

## 2021-11-06 DIAGNOSIS — Z362 Encounter for other antenatal screening follow-up: Secondary | ICD-10-CM

## 2021-11-06 DIAGNOSIS — Z98891 History of uterine scar from previous surgery: Secondary | ICD-10-CM

## 2021-11-06 DIAGNOSIS — O0992 Supervision of high risk pregnancy, unspecified, second trimester: Secondary | ICD-10-CM | POA: Insufficient documentation

## 2021-11-06 DIAGNOSIS — O09892 Supervision of other high risk pregnancies, second trimester: Secondary | ICD-10-CM

## 2021-11-06 DIAGNOSIS — O09293 Supervision of pregnancy with other poor reproductive or obstetric history, third trimester: Secondary | ICD-10-CM

## 2021-11-06 DIAGNOSIS — O09899 Supervision of other high risk pregnancies, unspecified trimester: Secondary | ICD-10-CM

## 2021-11-06 DIAGNOSIS — Z2839 Other underimmunization status: Secondary | ICD-10-CM

## 2021-11-06 DIAGNOSIS — O09212 Supervision of pregnancy with history of pre-term labor, second trimester: Secondary | ICD-10-CM

## 2021-11-06 DIAGNOSIS — Z3A25 25 weeks gestation of pregnancy: Secondary | ICD-10-CM | POA: Insufficient documentation

## 2021-11-07 ENCOUNTER — Ambulatory Visit: Payer: Self-pay

## 2021-11-07 ENCOUNTER — Other Ambulatory Visit: Payer: Self-pay

## 2021-11-07 NOTE — Progress Notes (Signed)
Presents for weekly 17P injection and tolerated with minimal complaint. Denies symptoms of premature labor. Aware of previously scheduled 11/14/2021 MHC RV appt and will receive next 17P at that appt. Jossie Ng, RN ? ?

## 2021-11-14 ENCOUNTER — Ambulatory Visit: Payer: Self-pay | Admitting: Family Medicine

## 2021-11-14 VITALS — BP 111/67 | HR 93 | Temp 97.7°F | Wt 184.0 lb

## 2021-11-14 DIAGNOSIS — O0992 Supervision of high risk pregnancy, unspecified, second trimester: Secondary | ICD-10-CM

## 2021-11-14 DIAGNOSIS — O0990 Supervision of high risk pregnancy, unspecified, unspecified trimester: Secondary | ICD-10-CM

## 2021-11-14 MED ORDER — PRENATAL VITAMINS 28-0.8 MG PO TABS: 1.0000 | ORAL_TABLET | Freq: Every day | ORAL | 0 refills | Status: AC

## 2021-11-14 NOTE — Progress Notes (Signed)
Here today for 26.4 MH RV. Taking PNV QD. Additional PNV's given. Denies ED/hospital visits since last RV. Reminded of [facility] [place] appt for 11/27/21 @ 10:30. Explained to patient provider will discuss risk/benefits for further 17P injections before 17P injection administration today. Hal Morales, RN ? ?

## 2021-11-14 NOTE — Progress Notes (Signed)
17P administrated without difficulty. One week appt scheduled for next injection as well as two week RV with 17P injection and 28 week labs. Tawny Hopping, RN ? ?

## 2021-11-15 NOTE — Progress Notes (Signed)
[facility] Department ?Maternal Health Clinic ? ?PRENATAL VISIT NOTE ? ?Subjective:  ?Gabrielle Romero is a 31 y.o. [phone] at [redacted]w[redacted]d being seen today for ongoing prenatal care.  She is currently monitored for the following issues for this high-risk pregnancy and has Abdominal pain; Cholelithiasis; Transaminitis; Biliary colic; Elevated LFTs; Vaginal bleeding affecting early pregnancy; Positive D dimer; Preterm delivery in [place] 15 years ago PPROM at 29 wks per pt and lived x 32 hrs; Supervision of high risk pregnancy, antepartum; History of C-section x 3; Neonatal death; and Maternal varicella, non-immune on their problem list. ? ?Patient reports no complaints.  Contractions: Irritability. Vag. Bleeding: None.  Movement: Present. Denies leaking of fluid/ROM.  ? ?The following portions of the patient's history were reviewed and updated as appropriate: allergies, current medications, past family history, past medical history, past social history, past surgical history and problem list. Problem list updated. ? ?Objective:  ? ?Vitals:  ? 11/14/21 1123  ?BP: 111/67  ?Pulse: 93  ?Temp: 97.7 ?F (36.5 ?C)  ?Weight: 184 lb (83.5 kg)  ? ? ?Fetal Status: Fetal Heart Rate (bpm): 145 Fundal Height: 25 cm Movement: Present    ? ?General:  Alert, oriented and cooperative. Patient is in no acute distress.  ?Skin: Skin is warm and dry. No rash noted.   ?Cardiovascular: Normal heart rate noted  ?Respiratory: Normal respiratory effort, no problems with respiration noted  ?Abdomen: Soft, gravid, appropriate for gestational age.  Pain/Pressure: Absent     ?Pelvic: Cervical exam deferred        ?Extremities: Normal range of motion.  Edema: Trace  ?Mental Status: Normal mood and affect. Normal behavior. Normal judgment and thought content.  ? ?Assessment and Plan:  ?Pregnancy: [id number] at [redacted]w[redacted]d ? ?1. Supervision of high risk pregnancy, antepartum ?Taking PNV as directed- more given today.  ?Reviewed [place], smaller for dates  but consistent growth  ?F/U US scheduled for 11/27/21.  ? ?- Prenatal Vit-Fe Fumarate-FA (PRENATAL VITAMINS) 28-0.8 MG TABS; Take 1 tablet by mouth daily.  Dispense: 100 tablet; Refill: 0 ? ?2. Preterm delivery in [place] 15 years ago PPROM at 48 wks per pt and lived x 32 hrs ? ?-undergoing treatment with 17-P due to her history of preterm delivery. We discussed today whether patient desires to continue these weekly injection in light of new clinical evidence.  ? ?We discussed the following:  ? ?October 18, 2021 the U.S. Food and Drug Administration  announced the final decision to withdraw approval of Makena and its generics ?because the drugs are no longer shown to be effective and the benefits do not outweigh the risks for the indication for which they were approved?. ?  ?April 2023 ACOG released the Practice Advisory Updated Clinical Guidance for the Use of Progesterone Supplementation for the Prevention of Recurrent Preterm Birth, which addressed the FDA withdrawal of Makena and its generics. ?Intramuscular 17-OHPC is not recommended for the primary prevention of preterm birth in patients with a history of spontaneous preterm birth.? ? ?The patient was told explicitly that [name] has not been shown to prevent preterm delivery.  ? ?The patient decided that they do want to continue treatment.  ? ?Preterm labor symptoms and general obstetric precautions including but not limited to vaginal bleeding, contractions, leaking of fluid and fetal movement were reviewed in detail with the patient. ?Please refer to After Visit Summary for other counseling recommendations.  ? ?Pt made informed decision to continue with injections for as long as she can. RN to  proceed with injection today  ? ? ?Future Appointments  ?Date Time Provider [place]  ?11/21/2021 10:00 AM AC-MH NURSE AC-MAT None  ?11/27/2021 10:30 AM [facility]  ?11/28/2021 10:00 AM AC-MH PROVIDER AC-MAT None  ?[facility] agency interpreter used for  Spanish interpretation.     ? ?Junious Dresser, FNP ? ?

## 2021-11-21 ENCOUNTER — Other Ambulatory Visit: Payer: Self-pay

## 2021-11-21 ENCOUNTER — Ambulatory Visit: Payer: Self-pay | Admitting: Advanced Practice Midwife

## 2021-11-21 VITALS — BP 109/68 | HR 80 | Temp 98.1°F | Wt 183.2 lb

## 2021-11-21 DIAGNOSIS — O0990 Supervision of high risk pregnancy, unspecified, unspecified trimester: Secondary | ICD-10-CM

## 2021-11-21 NOTE — Progress Notes (Signed)
Here today for 17P Injection only. Denies complaints or concerns. 17P given and tolerated well. Reminded patient of next scheduled RV and 17P injection appt for 11/28/21 @ 9:45. Tawny Hopping, RN ? ?

## 2021-11-22 ENCOUNTER — Other Ambulatory Visit: Payer: Self-pay

## 2021-11-22 DIAGNOSIS — O34219 Maternal care for unspecified type scar from previous cesarean delivery: Secondary | ICD-10-CM

## 2021-11-27 ENCOUNTER — Ambulatory Visit: Payer: Self-pay | Attending: Maternal & Fetal Medicine

## 2021-11-27 ENCOUNTER — Other Ambulatory Visit: Payer: Self-pay

## 2021-11-27 VITALS — BP 121/67 | HR 88 | Temp 98.2°F | Ht 65.0 in | Wt 186.0 lb

## 2021-11-27 DIAGNOSIS — O09213 Supervision of pregnancy with history of pre-term labor, third trimester: Secondary | ICD-10-CM

## 2021-11-27 DIAGNOSIS — Z3A28 28 weeks gestation of pregnancy: Secondary | ICD-10-CM

## 2021-11-27 DIAGNOSIS — O09899 Supervision of other high risk pregnancies, unspecified trimester: Secondary | ICD-10-CM

## 2021-11-27 DIAGNOSIS — O34219 Maternal care for unspecified type scar from previous cesarean delivery: Secondary | ICD-10-CM

## 2021-11-27 DIAGNOSIS — O09293 Supervision of pregnancy with other poor reproductive or obstetric history, third trimester: Secondary | ICD-10-CM

## 2021-11-28 ENCOUNTER — Ambulatory Visit: Payer: Self-pay | Admitting: Family Medicine

## 2021-11-28 VITALS — BP 109/66 | HR 91 | Temp 98.1°F | Wt 185.0 lb

## 2021-11-28 DIAGNOSIS — O0990 Supervision of high risk pregnancy, unspecified, unspecified trimester: Secondary | ICD-10-CM

## 2021-11-28 DIAGNOSIS — O0993 Supervision of high risk pregnancy, unspecified, third trimester: Secondary | ICD-10-CM

## 2021-11-28 DIAGNOSIS — Z23 Encounter for immunization: Secondary | ICD-10-CM

## 2021-11-28 LAB — HEMOGLOBIN, FINGERSTICK: Hemoglobin: 11.9 g/dL (ref 11.1–15.9)

## 2021-11-28 NOTE — Progress Notes (Addendum)
Patient here for MH RV at 28 4/7. 28 week labs, 1 hour gtt and Tdap. [facility] contact card given, Agricultural engineer given and BTL consent signed. Needs to be to lab by 11:20 for blood draw. 17-P given, right arm..Tdap given, tolerated well, VIS given, NCIR in accordian folder.Marland KitchenMarland KitchenJenetta Downer, RN  ?

## 2021-11-29 LAB — GLUCOSE TOLERANCE, 1 HOUR: Glucose, 1Hr PP: 192 mg/dL (ref 70–199)

## 2021-11-29 LAB — HIV-1/HIV-2 QUALITATIVE RNA
HIV-1 RNA, Qualitative: NONREACTIVE
HIV-2 RNA, Qualitative: NONREACTIVE

## 2021-11-29 LAB — RPR: RPR Ser Ql: NONREACTIVE

## 2021-11-30 NOTE — Progress Notes (Signed)
BTL consent faxed to [facility] L&D with confirmation. [facility] MFM consult referral faxed with patient records, last 2 [facility] M[facility] U/S (last 2 per provider request) and snapshot to [facility] MFM with confirmation.Burt Knack, RN

## 2021-11-30 NOTE — Progress Notes (Signed)
[place] Department Maternal Health Clinic  PRENATAL VISIT NOTE  Subjective:  Gabrielle Romero is a 31 y.o. [phone] at [redacted]w[redacted]d being seen today for ongoing prenatal care.  She is currently monitored for the following issues for this high-risk pregnancy and has Abdominal pain; Cholelithiasis; Transaminitis; Biliary colic; Elevated LFTs; Vaginal bleeding affecting early pregnancy; Positive D dimer; Preterm delivery in [place] 15 years ago PPROM at 29 wks per pt and lived x 32 hrs; Supervision of high risk pregnancy, antepartum; History of C-section x 3; Neonatal death; and Maternal varicella, non-immune on their problem list.  Patient reports no complaints.  Contractions: Irritability. Vag. Bleeding: None.  Movement: Present. Denies leaking of fluid/ROM.   The following portions of the patient's history were reviewed and updated as appropriate: allergies, current medications, past family history, past medical history, past social history, past surgical history and problem list. Problem list updated.  Objective:   Vitals:   11/28/21 1019  BP: 109/66  Pulse: 91  Temp: 98.1 F (36.7 C)  Weight: 185 lb (83.9 kg)    Fetal Status: Fetal Heart Rate (bpm): 140 Fundal Height: 26 cm Movement: Present     General:  Alert, oriented and cooperative. Patient is in no acute distress.  Skin: Skin is warm and dry. No rash noted.   Cardiovascular: Normal heart rate noted  Respiratory: Normal respiratory effort, no problems with respiration noted  Abdomen: Soft, gravid, appropriate for gestational age.  Pain/Pressure: Absent     Pelvic: Cervical exam deferred        Extremities: Normal range of motion.  Edema: Trace  Mental Status: Normal mood and affect. Normal behavior. Normal judgment and thought content.   Assessment and Plan:  Pregnancy: [id number] at [redacted]w[redacted]d  1. Supervision of high risk pregnancy, antepartum - TWG -taking PNV  -28 week labs today  -reviewed MFM US - Impression   Follow up growth due to prior EFW 13%, suggestive of small  for gestational age.  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  There is no evidence of hydroureter.  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 Approximately one third of the duplex  kidney is drained by the upper pole ureter and two-thirds  drained by the lower pole ureter.  Many individuals with duplex  kidneys are asymptomatic and have no impairment of renal  function. The prevalence of duplicated collecting system is  difficult to estimate because most cases are not identified  prenatally.   If the diagnosis of a duplicated collecting system is made  prenatally, neonatal evaluation typically incudes a renal  ultrasound in post-delivery period. The major benefit of  prenatal diagnosis appears to be in the use of prophylactic  antibiotics to prevent future urinary tract infections.  Recommendations  Follow up growth in 4 weeks.  - Pt has follow up MFM [place] on 6/13 ,suggessted to keep this appointment until [facility] appointment can be made.    - RPR - Glucose tolerance, 1 hour - Hemoglobin, fingerstick - HIV-1/HIV-2 Qualitative RNA  2. Preterm delivery in [place] 15 years ago PPROM at 26 wks per pt and lived x 32 hrs  Gabrielle Romero is a [id number]  at [redacted]w[redacted]d that is currently undergoing treatment with 17-P due to her history of preterm delivery.   We discussed today whether patient desires to continue these weekly injection in light of new clinical evidence.   We discussed the following:   October 18, 2021 the U.S. Food and Drug Administration  announced the final decision to withdraw approval of  Makena and its generics "because the drugs are no longer shown to be effective and the benefits do not outweigh the risks for the indication for which they were approved".   April 2023 ACOG released the Practice Advisory Updated Clinical Guidance for the Use of Progesterone Supplementation for the Prevention of Recurrent Preterm Birth, which addressed the FDA withdrawal of Makena and its generics. "Intramuscular 17-OHPC is not recommended for the primary prevention of preterm birth in patients with a history of spontaneous preterm birth."  The patient was told explicitly that [name] has not been shown to prevent preterm delivery.   The patient decided that they do want to continue treatment.   Junious Dresser, FNP   Preterm labor symptoms and general obstetric precautions including but not limited to vaginal bleeding, contractions, leaking of fluid and fetal movement were reviewed in detail with the patient. Please refer to After Visit Summary for other counseling recommendations.  No follow-ups on file.  Future Appointments  Date Time Provider [place]  12/05/2021  9:20 AM [facility]-MH NURSE AC-MAT None  12/12/2021 10:20 AM [facility]-MH PROVIDER AC-MAT None  12/25/2021 11:00 AM [facility] [place]   [facility] agency interpreter used for Spanish interpretation.     Junious Dresser, FNP

## 2021-12-05 ENCOUNTER — Other Ambulatory Visit: Payer: Self-pay

## 2021-12-05 NOTE — Progress Notes (Signed)
In nurse clinic for Gabrielle Romero. Lang line [phone] Reports headache and sore throat x 2 days, no fever. Taking tylenol.  Consult A White, FNP who recommends pt to do COVID test and to notify [facility] if positive. Pt may use throat lozenges and salt water gargles to ease sore throat. Pt to refer to Safe Meds in Preg. Info sheet.   Pt reports "hard" abdomen when wakes up in morning but goes away.  Consult A White, FNP who recommends pt to drink 3 glasses water and to lie on left side. If contractions do not go away, pt to call [facility]/go to ER. Pt to refer to info sheet for Preterm Labor.   RN carried out provider orders. Pt questions answered and verbalizes understanding. Pt says she has Safe med in preg sheet and also preterm labor sheet and understands what to do if preterm labor signs.   Gabrielle Romero (per order by A White, FNP 09/07/2021) administered L arm without prob. Next MHC appt 12/12/2021, pt aware.   Jerel Shepherd, RN

## 2021-12-12 ENCOUNTER — Ambulatory Visit: Payer: Self-pay | Admitting: Advanced Practice Midwife

## 2021-12-12 ENCOUNTER — Encounter: Payer: Self-pay | Admitting: Family Medicine

## 2021-12-12 DIAGNOSIS — O0990 Supervision of high risk pregnancy, unspecified, unspecified trimester: Secondary | ICD-10-CM

## 2021-12-12 DIAGNOSIS — Z98891 History of uterine scar from previous surgery: Secondary | ICD-10-CM

## 2021-12-12 DIAGNOSIS — O0993 Supervision of high risk pregnancy, unspecified, third trimester: Secondary | ICD-10-CM

## 2021-12-12 DIAGNOSIS — R9389 Abnormal findings on diagnostic imaging of other specified body structures: Secondary | ICD-10-CM | POA: Insufficient documentation

## 2021-12-12 DIAGNOSIS — O99810 Abnormal glucose complicating pregnancy: Secondary | ICD-10-CM | POA: Insufficient documentation

## 2021-12-12 NOTE — Progress Notes (Signed)
[facility]  PRENATAL VISIT NOTE  Subjective:  Gabrielle Romero is a 31 y.o. [phone] at [redacted]w[redacted]d being seen today for ongoing prenatal care.  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  Patient reports no complaints.   .  .   . Denies leaking of fluid/ROM.   The following portions of the patient's history were reviewed and updated as appropriate: allergies, current medications, past family history, past medical history, past social history, past surgical history and problem list. Problem list updated.  Objective:   Vitals:   12/12/21 1037  BP: 106/62  Pulse: 89  Temp: 98.1 F (36.7 C)  Weight: 187 lb 6.4 oz (85 kg)    Fetal Status: Fetal Heart Rate (bpm): 130 Fundal Height: 30 cm       General:  Alert, oriented and cooperative. Patient is in no acute distress.  Skin: Skin is warm and dry. No rash noted.   Cardiovascular: Normal heart rate noted  Respiratory: Normal respiratory effort, no problems with respiration noted  Abdomen: Soft, gravid, appropriate for gestational age.        Pelvic: Cervical exam deferred        Extremities: Normal range of motion.  Edema: Trace  Mental Status: Normal mood and affect. Normal behavior. Normal judgment and thought content.   Assessment and Plan:  Pregnancy: [id number] at [redacted]w[redacted]d  1. History of C-section x 3 Referral written for delivery plans apt for repeat c/s and faxed today  2. Preterm delivery in  [place] 15 years ago PPROM at 58 wks per pt and lived x 32 hrs Pt has been counseled re: 17P and chooses to continue receiving weekly  3. Supervision of high risk pregnancy, antepartum 23 lb 6.4 oz (10.6 kg) Not working Walking 5x/wk x 20-30 min Reviewed 11/27/21 u/s at 27 6/7 with AFI wnl, EFW=19%, normal interval growth, left duplicated collecting system with a dilated upper renal pole 7.4 mm Next u/s 12/25/21 1 hour glucola on 11/28/21=192. Needs 3 hour GTT--pt to come in tomorrow 0800 for 3 hour GTT  4. Neonatal death At 29 wks   5. 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 Please refer to After Visit Summary for other counseling recommendations.  Return in about 1 day (around 12/13/2021) for 3 hour GTT, and 2 wk RV.  Future Appointments  Date Time Provider [place]  12/13/2021  8:20 AM [facility] PROVIDER [facility] None  12/25/2021 11:00 AM [facility] [place], CNM

## 2021-12-12 NOTE — Progress Notes (Signed)
17-P given during MH RV at left arm - tolerated well.   Delivery Plans appt referral faxed to [facility] 12/12/21 with confirmation.   Patient made aware of elevated glucose level from 11/28/21. Made aware to return 12/13/21 to clinic to follow-up on 3 hr gtt at 8 am.   Floy Sabina, RN

## 2021-12-13 ENCOUNTER — Ambulatory Visit: Payer: Self-pay | Admitting: Advanced Practice Midwife

## 2021-12-13 DIAGNOSIS — O0993 Supervision of high risk pregnancy, unspecified, third trimester: Secondary | ICD-10-CM

## 2021-12-13 NOTE — Progress Notes (Signed)
Client did not fast enough hours in preparation for 3 hour GTT and test rescheduled for tomorrow am. Hazle Coca CNM aware. Client correctly verbalized test prep instructions to RN this am. Appt scheduled and reminder card given. 2 week MHC RV appt also scheduled for client and appt card given. Client plans to schedule 1 week appt for 17 P next week with clerical. Jossie Ng, RN

## 2021-12-14 ENCOUNTER — Ambulatory Visit: Payer: Self-pay | Admitting: Advanced Practice Midwife

## 2021-12-14 DIAGNOSIS — O99810 Abnormal glucose complicating pregnancy: Secondary | ICD-10-CM

## 2021-12-14 DIAGNOSIS — O0993 Supervision of high risk pregnancy, unspecified, third trimester: Secondary | ICD-10-CM

## 2021-12-14 NOTE — Progress Notes (Signed)
Here for 3 hour GTT only today

## 2021-12-14 NOTE — Progress Notes (Signed)
Spanish Interpretor # [phone], , [place], utilized during the initial part of visit. Last ate/drank at 1930 pm.

## 2021-12-15 LAB — GLUCOSE TOLERANCE TEST, 6 HOUR
Glucose, 1 Hour GTT: 203 mg/dL — ABNORMAL HIGH (ref 70–199)
Glucose, 2 hour: 214 mg/dL — ABNORMAL HIGH (ref 70–139)
Glucose, 3 hour: 148 mg/dL — ABNORMAL HIGH (ref 70–109)
Glucose, GTT - Fasting: 93 mg/dL (ref 70–99)

## 2021-12-17 ENCOUNTER — Encounter: Payer: Self-pay | Admitting: Advanced Practice Midwife

## 2021-12-17 ENCOUNTER — Other Ambulatory Visit: Payer: Self-pay | Admitting: Advanced Practice Midwife

## 2021-12-17 ENCOUNTER — Telehealth: Payer: Self-pay

## 2021-12-17 DIAGNOSIS — O24419 Gestational diabetes mellitus in pregnancy, unspecified control: Secondary | ICD-10-CM

## 2021-12-17 HISTORY — DX: Gestational diabetes mellitus in pregnancy, unspecified control: O24.419

## 2021-12-17 MED ORDER — LANCETS MISC: 1.0000 [IU] | Freq: Four times a day (QID) | 12 refills | Status: AC

## 2021-12-17 MED ORDER — BLOOD GLUCOSE MONITOR KIT: PACK | 0 refills | Status: AC

## 2021-12-17 MED ORDER — BLOOD GLUCOSE TEST VI STRP: 1.0000 [IU] | ORAL_STRIP | Freq: Four times a day (QID) | 12 refills | Status: AC

## 2021-12-17 NOTE — Telephone Encounter (Signed)
Telephone call to patient today regarding her Lab result and the message by provider.  Left message for patient to return my call at [phone].  Language Line used today.  Byrd Hesselbach / [phone].  Hart Carwin, RN

## 2021-12-17 NOTE — Telephone Encounter (Signed)
-----   Message from Herbie Saxon, CNM sent at 12/17/2021  1:10 PM EDT ----- Gestational diabetic Please call pt and inform her of dx, need for weekly apts, to pick up diabetic supplies at her pharmacy, and to await phone call from [facility] for her diabetic apt. Thanks Supplies ordered, referral written to [facility] diabetic clinic

## 2021-12-18 ENCOUNTER — Other Ambulatory Visit: Payer: Self-pay

## 2021-12-18 DIAGNOSIS — O09892 Supervision of other high risk pregnancies, second trimester: Secondary | ICD-10-CM

## 2021-12-18 DIAGNOSIS — O34219 Maternal care for unspecified type scar from previous cesarean delivery: Secondary | ICD-10-CM

## 2021-12-18 DIAGNOSIS — O09893 Supervision of other high risk pregnancies, third trimester: Secondary | ICD-10-CM

## 2021-12-18 NOTE — Telephone Encounter (Signed)
Telephone call to patient regarding her abnormal 3 HR GTT and the [facility] diabetic referral and the need to pick up her supplies from her pharmacy.  She reports [facility] calling and saying she had some items to pick up.  She will pick the items up and wait for [facility] to call for the appointment.  If she has not heard from [facility] by Friday she is to call [facility] back.  Language Line used today.  Byrd Hesselbach / [phone].  Hart Carwin, RN

## 2021-12-19 ENCOUNTER — Other Ambulatory Visit: Payer: Self-pay

## 2021-12-19 NOTE — Progress Notes (Addendum)
In Nurse Clinic for 17 P. Denies any signs of preterm labor and pt states she knows what to do if preterm signs develop. Pt is aware this  17 P injection today is her last one. 17 P administered today without problem. Order by Onalee Hua, FNP dated 09/07/21. Pt aware of upcoming appts: ultrasound on 12/25/2021 and MHC RV on 12/26/21. Juliene Pina, interpreter today. Jerel Shepherd, RN

## 2021-12-20 ENCOUNTER — Other Ambulatory Visit: Payer: Self-pay

## 2021-12-20 NOTE — Addendum Note (Signed)
Addended by: Heywood Bene on: 12/20/2021 03:28 PM   Modules accepted: Orders

## 2021-12-25 ENCOUNTER — Other Ambulatory Visit: Payer: Self-pay

## 2021-12-25 ENCOUNTER — Other Ambulatory Visit: Payer: Self-pay | Admitting: Maternal & Fetal Medicine

## 2021-12-25 ENCOUNTER — Ambulatory Visit: Payer: Self-pay | Attending: Maternal & Fetal Medicine

## 2021-12-25 DIAGNOSIS — O24419 Gestational diabetes mellitus in pregnancy, unspecified control: Secondary | ICD-10-CM | POA: Insufficient documentation

## 2021-12-25 DIAGNOSIS — O09293 Supervision of pregnancy with other poor reproductive or obstetric history, third trimester: Secondary | ICD-10-CM | POA: Insufficient documentation

## 2021-12-25 DIAGNOSIS — O09213 Supervision of pregnancy with history of pre-term labor, third trimester: Secondary | ICD-10-CM

## 2021-12-25 DIAGNOSIS — O09893 Supervision of other high risk pregnancies, third trimester: Secondary | ICD-10-CM

## 2021-12-25 DIAGNOSIS — O24410 Gestational diabetes mellitus in pregnancy, diet controlled: Secondary | ICD-10-CM

## 2021-12-25 DIAGNOSIS — O34219 Maternal care for unspecified type scar from previous cesarean delivery: Secondary | ICD-10-CM

## 2021-12-25 DIAGNOSIS — Z3A32 32 weeks gestation of pregnancy: Secondary | ICD-10-CM | POA: Insufficient documentation

## 2021-12-25 DIAGNOSIS — O365930 Maternal care for other known or suspected poor fetal growth, third trimester, not applicable or unspecified: Secondary | ICD-10-CM | POA: Insufficient documentation

## 2021-12-25 IMAGING — US US MFM OB FOLLOW-UP
1 series · 13 of 28 positions shown · non-contrast
Comparison: none

[Series 1: us mfm ob follow-up · 44 acquisitions, 13 frames shown]
[im 2/44]
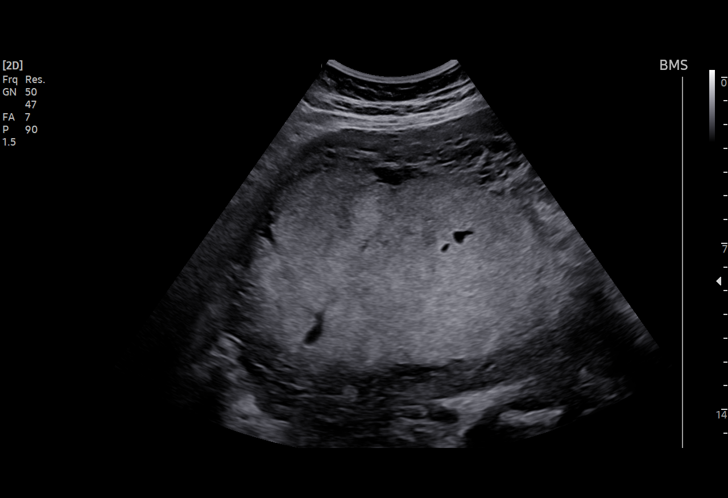
[im 5/44]
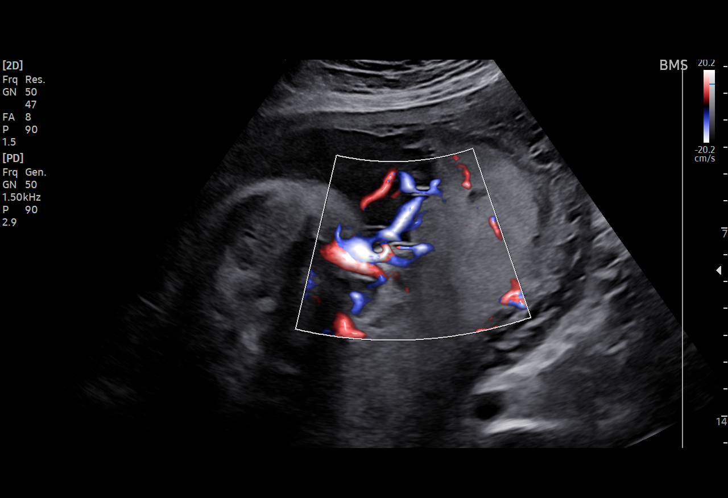
[im 8/44]
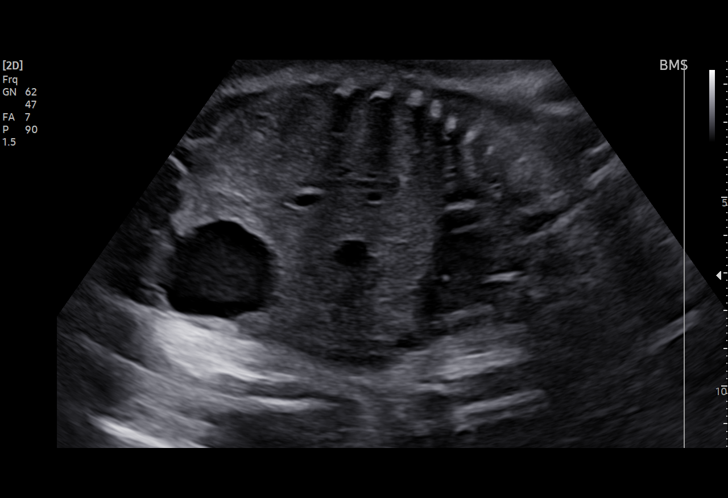
[im 12/44]
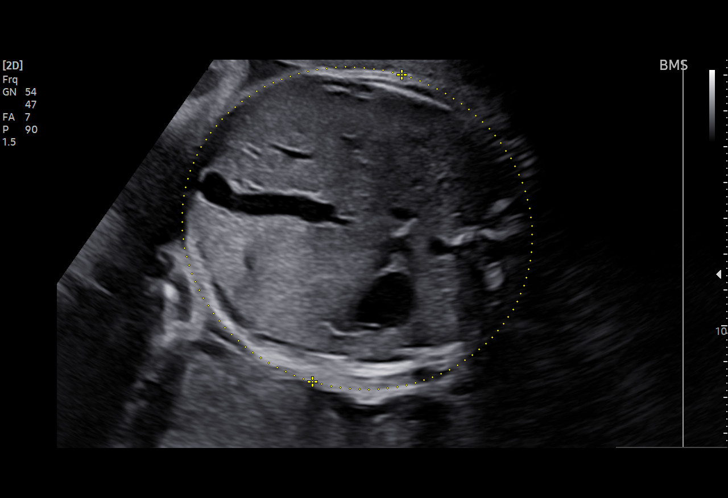
[im 15/44]
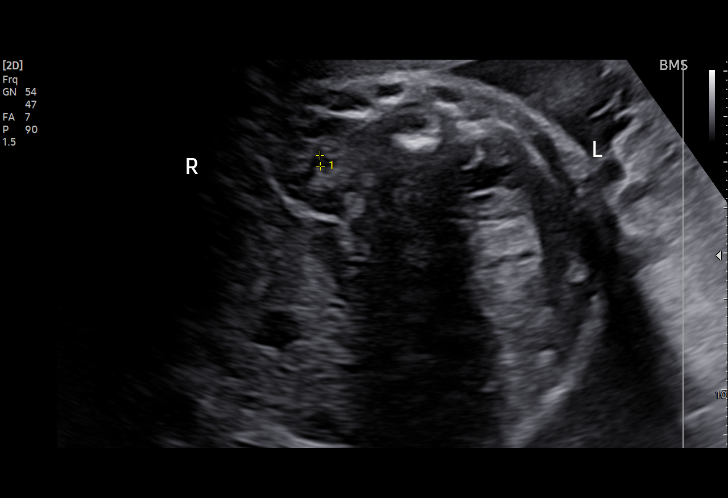
[im 18/44]
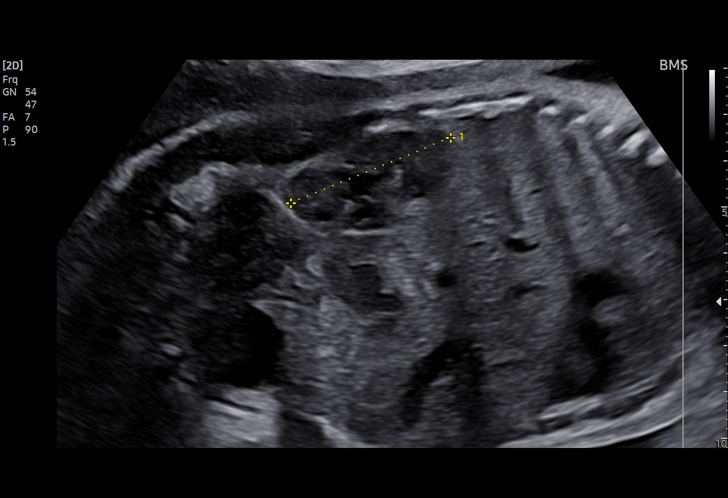
[im 23/44]
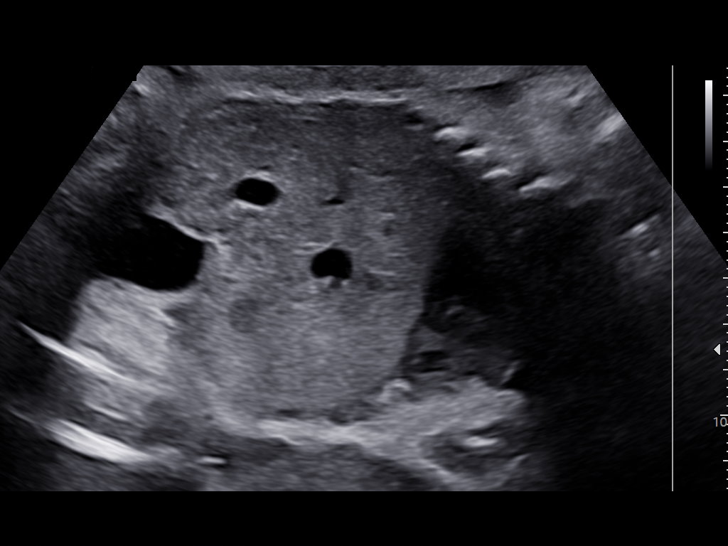
[im 26/44]
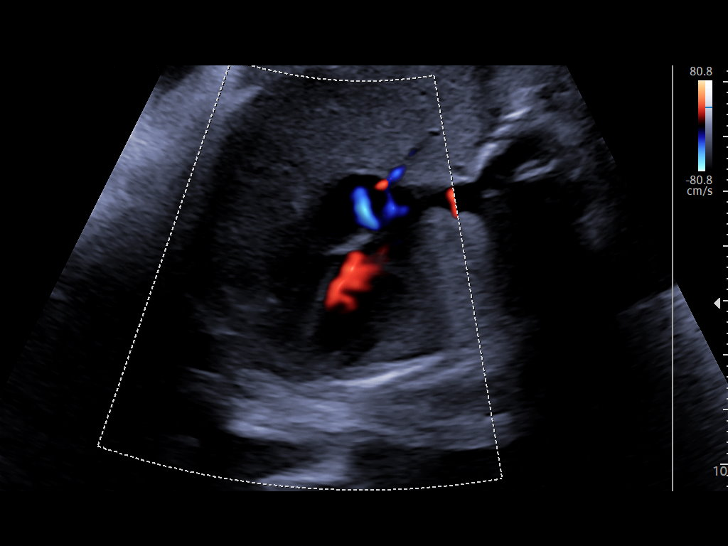
[im 29/44]
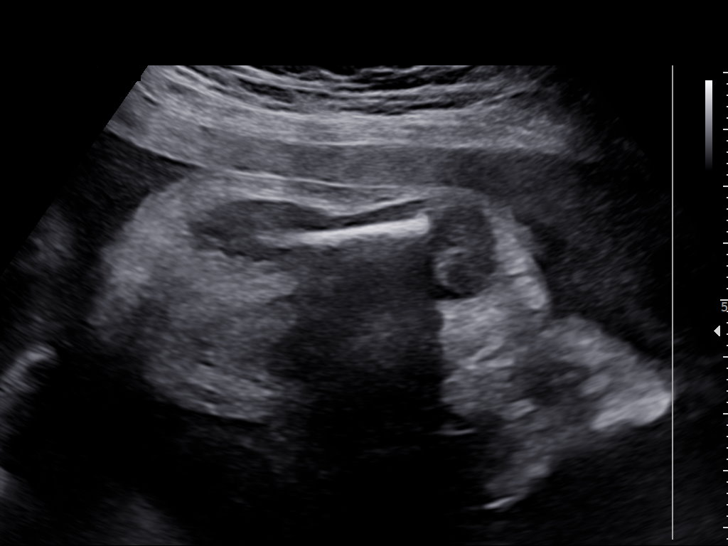
[im 32/44]
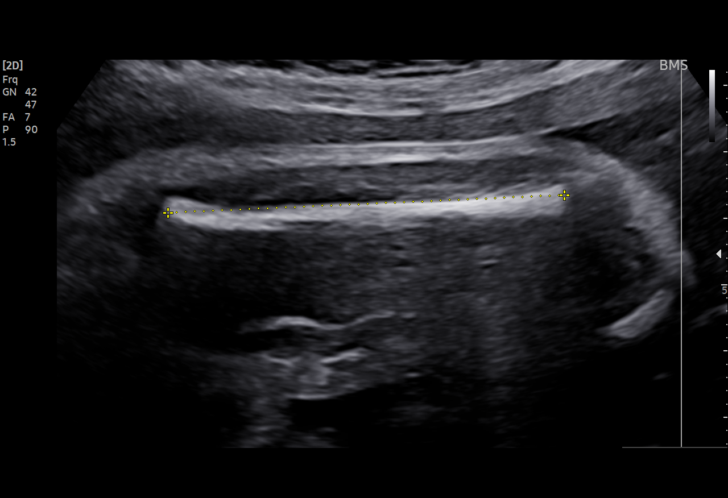
[im 36/44]
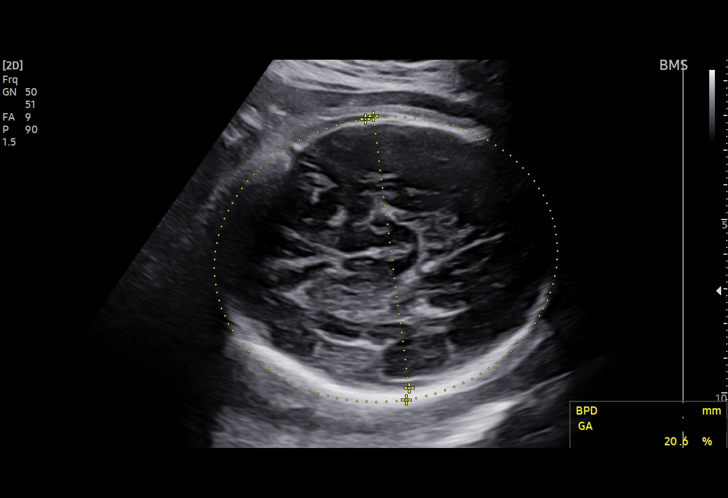
[im 39/44]
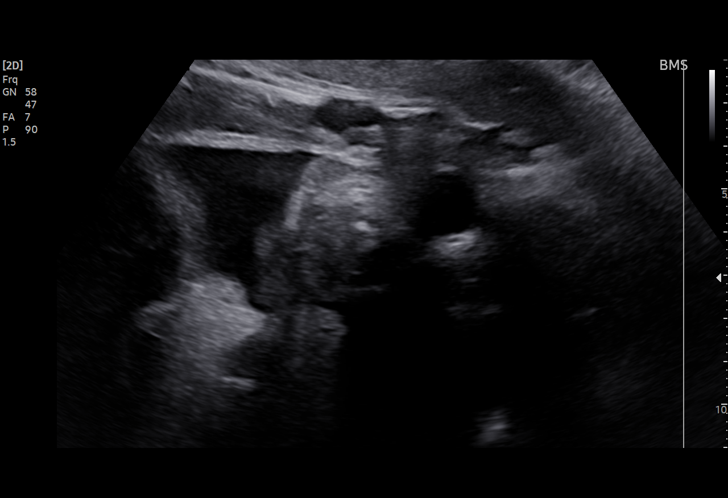
[im 42/44]
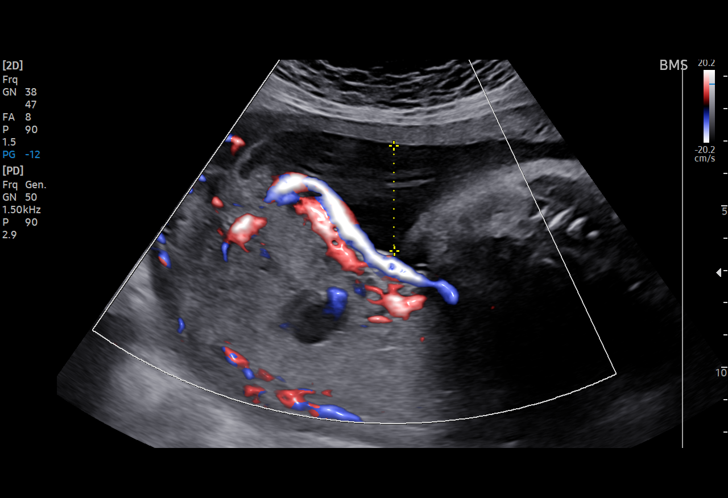

[13 of 28 positions shown; findings below may reference images not displayed]

DON LOLITO

Indications

 Gestational diabetes in pregnancy, diet        [I5]
 controlled
 Poor obstetric history: Previous preterm       [I5]
 delivery, antepartum
 Poor obstetric history: Previous neonatal      [I5]
 death
 Previous cesarean delivery, antepartum         [I5]
 32 weeks gestation of pregnancy
Fetal Evaluation

 Num Of Fetuses:         1
 Fetal Heart Rate(bpm):  147
 Cardiac Activity:       Observed
 Presentation:           Cephalic
 Placenta:               Posterior
 P. Cord Insertion:      Visualized

 Amniotic Fluid
 AFI FV:      Within normal limits

 AFI Sum(cm)     %Tile       Largest Pocket(cm)
 9.9             16

 RUQ(cm)       RLQ(cm)       LUQ(cm)        LLQ(cm)

Biophysical Evaluation
 Amniotic F.V:   Within normal limits       F. Tone:        Observed
 F. Movement:    Observed                   Score:          [DATE]
 F. Breathing:   Observed
Biometry

 BPD:     79.25  mm     G. Age:  31w 6d         24  %    CI:        78.26   %    70 - 86
                                                         FL/HC:      21.0   %    19.1 -
 HC:    283.41   mm     G. Age:  31w 1d        1.9  %    HC/AC:      0.96        0.96 -
 AC:    295.86   mm     G. Age:  33w 4d         81  %    FL/BPD:     74.9   %    71 - 87
 FL:      59.39  mm     G. Age:  31w 0d          8  %    FL/AC:      20.1   %    20 - 24
 HUM:      51.1  mm     G. Age:  30w 0d        < 5  %
 LV:        3.7  mm

 Est. FW:    [I5]  gm      4 lb 5 oz     39  %
OB History

 Gravidity:    5         Term:   2        Prem:   1        SAB:   1
 TOP:          0       Ectopic:  0        Living: 2
Gestational Age

 LMP:           32w 3d        Date:  [DATE]                 EDD:   [DATE]
 U/S Today:     31w 6d                                        EDD:   [DATE]
 Best:          32w 3d     Det. By:  LMP  ([DATE])          EDD:   [DATE]
Anatomy

 Cranium:               Appears normal         Aortic Arch:            Previously seen
 Cavum:                 Appears normal         Ductal Arch:            Previously seen
 Ventricles:            Appears normal         Diaphragm:              Appears normal
 Choroid Plexus:        Previously seen        Stomach:                Appears normal, left
                                                                       sided
 Cerebellum:            Previously seen        Abdomen:                Appears normal
 Posterior Fossa:       Previously seen        Abdominal Wall:         Previously seen
 Nuchal Fold:           Previously seen        Cord Vessels:           Previously seen
 Face:                  Orbits and profile     Kidneys:                Abnormal LT, see
                        previously seen
                                                                       comments
 Lips:                  Previously seen        Bladder:                Appears normal
 Thoracic:              Appears normal         Spine:                  Previously seen
 Heart:                 Appears normal         Upper Extremities:      Previously seen
                        (4CH, axis, and
                        situs)
 RVOT:                  Previously seen        Lower Extremities:      Previously seen
 LVOT:                  Previously seen

 Other:  VC, 3VV and 3VTV previously visualized. Heels/feet and open
         hands/5th digits prev visualized. Nasal bone, lenses, maxilla,
         mandible and falx all previously visualized.
Cervix Uterus Adnexa

 Cervix
 Not visualized (advanced GA >[I5])
Impression
 Follow up growth due to known duplicated collecting system
 with a dilated left renal pelivs.
 Normal interval growth with measurements consistent with
 dates
 Good fetal movement and amniotic fluid volume
 Biophysical profile [DATE]

 Renal pelvis dilation persist at 8.6 mm slightly increased from
 previous measurements of 7.4 mm
 She also has a new diagnosis of [I5] with an abnormal
 3hr GTT on [DATE].

 A referral was placed by her provider for a [HOSPITAL] referral for
 diabetic teaching and possible delivery planning.
Recommendations

 Follow growth is scheduled in 4 weeks.

## 2021-12-26 ENCOUNTER — Ambulatory Visit: Payer: Self-pay | Admitting: Advanced Practice Midwife

## 2021-12-26 VITALS — BP 103/65 | HR 90 | Temp 97.7°F | Wt 190.8 lb

## 2021-12-26 DIAGNOSIS — Z98891 History of uterine scar from previous surgery: Secondary | ICD-10-CM

## 2021-12-26 DIAGNOSIS — O0990 Supervision of high risk pregnancy, unspecified, unspecified trimester: Secondary | ICD-10-CM

## 2021-12-26 DIAGNOSIS — O24419 Gestational diabetes mellitus in pregnancy, unspecified control: Secondary | ICD-10-CM

## 2021-12-26 DIAGNOSIS — O99810 Abnormal glucose complicating pregnancy: Secondary | ICD-10-CM

## 2021-12-26 DIAGNOSIS — R9389 Abnormal findings on diagnostic imaging of other specified body structures: Secondary | ICD-10-CM

## 2021-12-26 LAB — URINALYSIS
Bilirubin, UA: NEGATIVE
Glucose, UA: NEGATIVE
Ketones, UA: NEGATIVE
Nitrite, UA: NEGATIVE
Protein,UA: NEGATIVE
RBC, UA: NEGATIVE
Specific Gravity, UA: 1.02 (ref 1.005–1.030)
Urobilinogen, Ur: 0.2 mg/dL (ref 0.2–1.0)
pH, UA: 7 (ref 5.0–7.5)

## 2021-12-26 NOTE — Progress Notes (Signed)
Patient here for MH RV at 32 2/7. Kick counts reviewed today and cards given. States she has [facility] contact card.  Had [facility] consult yesterday and [facility] MFM U/S. Started checking blood sugars today. Fasting sugar this morning was 115, patient has photo on her phone. She just took her BS again, 1 hour after breakfast, =178. Needs urine dip today. Has follow-up [facility] MFM U/S on 01/22/22, [facility] follow-up with MFM on 01/01/22, and preadmission testing 01/25/22 and per [facility], C-section scheduled for 01/28/22.Marland KitchenMarland KitchenMarland KitchenBurt Knack, RN   Urine dip reviewed.Burt Knack, RN

## 2021-12-26 NOTE — Progress Notes (Signed)
[facility]  PRENATAL VISIT NOTE  Subjective:  Gabrielle Romero is a 31 y.o. [phone] at [redacted]w[redacted]d being seen today for ongoing prenatal care.  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  Patient reports no complaints.  Contractions: Not present. Vag. Bleeding: None.  Movement: Present. Denies leaking of fluid/ROM.   The following portions of the patient's history were reviewed and updated as appropriate: allergies, current medications, past family history, past medical history, past social history, past surgical history and problem list. Problem list updated.  Objective:   Vitals:   12/26/21 1015  BP: 103/65  Pulse: 90  Temp: 97.7 F (36.5 C)  Weight: 190 lb 12.8 oz (86.5 kg)    Fetal Status: Fetal Heart Rate (bpm): 140 Fundal Height: 32 cm Movement: Present     General:  Alert, oriented and cooperative. Patient is in no acute distress.  Skin: Skin is warm and dry. No rash noted.   Cardiovascular: Normal heart rate noted  Respiratory: Normal respiratory effort, no problems with respiration noted  Abdomen: Soft, gravid, appropriate for gestational age.  Pain/Pressure: Absent     Pelvic: Cervical exam deferred        Extremities: Normal range of motion.  Edema: None  Mental Status: Normal mood and affect. Normal behavior. Normal judgment and  thought content.   Assessment and Plan:  Pregnancy: [phone] at 32w4  - Urinalysis  2. Gestational diabetes mellitus (GDM) dx'd 12/14/21 Blood sugar log values below. Encouraged continued daily exercise and diet modifications. Pt had first [facility] diabetic class yesterday and checked her first blood sugars this am but did not bring BS log FBS this am=115.  1 hour pp=178 Exercise: walks 5x/wk x 20-30 min Diet recall :   Breakfast = 1 bowl corn flakes with 2% milk, 1 banana                      Lunch = none today yet                      Dinner = last night: spinach, 1 egg, 2 tortillas, water                      Snack = oj Breakfast yesterday: 1 banana shake Water: drinks about  4-5 bottles of water/day and night  - Urinalysis: neg glucose Has second MFM diabetes apt at [facility] 01/01/22 Reviewed 12/25/21 MFM note which states "she also has A2GDM; we are starting insulin today and will see her in 1 week to assess control". Pt knows nothing about insulin and states she was only given literature on diabetes education and no verbal instruction.   3. Supervision of high risk pregnancy, antepartum 26 lb 12.8 oz (12.2 kg) Gained 3 lbs in last 2 wks Has 01/25/22 lab at [facility] for preop Not working  4. Preterm delivery in [place] 15 years ago PPROM at  29 wks per pt and lived x 32 hrs See notes from #7 below  5. History of C-section x 3 2013 c/s in [place] at 38 wks but unable to obtain op note Delivery plans apt 12/25/21 Repeat c/s scheduled for 01/28/22 with BTL   6. 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 Known duplicated collecting system with dilated left renal pelvis revisualized with renal pelvis dilated now at 8.6 mm, slightly increased from previous measurement of 7.4 mm F/u u/s 01/22/22 with [facility] MFM  7. Neonatal death at 29 wks 29 wks for PPROM in 2008 in [place] with subsequent neonatal demise after  living x 32 hours Pt desires to continue 17-P weekly    Preterm labor symptoms and general obstetric precautions including but not limited to vaginal bleeding, contractions, leaking of fluid and fetal movement were reviewed in detail with the patient. Please refer to After Visit Summary for other counseling recommendations.  Return in about 1 week (around 01/02/2022) for routine PNC.  Future Appointments  Date Time Provider [place]  01/22/2022 10:30 AM [facility] [place], CNM

## 2022-01-02 ENCOUNTER — Ambulatory Visit: Payer: Self-pay

## 2022-01-20 ENCOUNTER — Other Ambulatory Visit: Payer: Self-pay

## 2022-01-20 DIAGNOSIS — O24415 Gestational diabetes mellitus in pregnancy, controlled by oral hypoglycemic drugs: Secondary | ICD-10-CM

## 2022-01-20 DIAGNOSIS — Z98891 History of uterine scar from previous surgery: Secondary | ICD-10-CM

## 2022-01-20 DIAGNOSIS — O34219 Maternal care for unspecified type scar from previous cesarean delivery: Secondary | ICD-10-CM

## 2022-01-20 DIAGNOSIS — O0990 Supervision of high risk pregnancy, unspecified, unspecified trimester: Secondary | ICD-10-CM

## 2022-01-22 ENCOUNTER — Ambulatory Visit: Payer: Self-pay

## 2023-01-13 ENCOUNTER — Ambulatory Visit: Payer: Self-pay

## 2023-02-24 ENCOUNTER — Ambulatory Visit (LOCAL_COMMUNITY_HEALTH_CENTER): Payer: Self-pay

## 2023-02-24 VITALS — BP 119/65 | Wt 181.0 lb

## 2023-02-24 DIAGNOSIS — Z3201 Encounter for pregnancy test, result positive: Secondary | ICD-10-CM

## 2023-02-24 DIAGNOSIS — Z309 Encounter for contraceptive management, unspecified: Secondary | ICD-10-CM

## 2023-02-24 LAB — PREGNANCY, URINE: Preg Test, Ur: POSITIVE — AB

## 2023-02-24 MED ORDER — PRENATAL 27-0.8 MG PO TABS
1.0000 | ORAL_TABLET | Freq: Every day | ORAL | Status: AC
Start: 2023-02-24 — End: 2023-06-04

## 2023-02-24 NOTE — Progress Notes (Signed)
UPT positive. Consulted with E. Sciora, CNM, as the patient has a hx of OB problems, who advises that the patient been seen in a high risk OBGYN as soon as possible. RN explained this to patient; patient agrees and plans to be seen at [facility]. Patient sent to clerk for set-up of presumptive eligibility.   The patient was dispensed prenatal vitamins #100 today. I provided counseling today regarding the medication. We discussed the medication, the side effects and when to call clinic.   Positive pregnancy packet reviewed and given to patient. Also counseled on hydration and when to seek medical attention.   Patient given the opportunity to ask questions. Questions answered.  Osborn Coho, interpreter, present for this visit.  Abagail Kitchens, RN

## 2023-04-27 ENCOUNTER — Other Ambulatory Visit: Payer: Self-pay

## 2023-04-27 ENCOUNTER — Emergency Department
Admission: EM | Admit: 2023-04-27 | Discharge: 2023-04-27 | Disposition: A | Discharge status: Home / Self Care | Payer: Medicaid Other | Attending: Emergency Medicine | Admitting: Emergency Medicine

## 2023-04-27 DIAGNOSIS — O26892 Other specified pregnancy related conditions, second trimester: Secondary | ICD-10-CM | POA: Insufficient documentation

## 2023-04-27 DIAGNOSIS — Z3A17 17 weeks gestation of pregnancy: Secondary | ICD-10-CM | POA: Insufficient documentation

## 2023-04-27 LAB — URINALYSIS, ROUTINE W REFLEX MICROSCOPIC
Bilirubin Urine: NEGATIVE
Glucose, UA: NEGATIVE mg/dL
Hgb urine dipstick: NEGATIVE
Ketones, ur: NEGATIVE mg/dL
Nitrite: NEGATIVE
Protein, ur: NEGATIVE mg/dL
Specific Gravity, Urine: 1.023 (ref 1.005–1.030)
pH: 6 (ref 5.0–8.0)

## 2023-04-27 LAB — HEPATIC FUNCTION PANEL
ALT: 11 U/L (ref 0–44)
AST: 14 U/L — ABNORMAL LOW (ref 15–41)
Albumin: 3.3 g/dL — ABNORMAL LOW (ref 3.5–5.0)
Alkaline Phosphatase: 47 U/L (ref 38–126)
Bilirubin, Direct: <0.1 mg/dL (ref 0.0–0.2)
Total Bilirubin: 0.3 mg/dL (ref 0.3–1.2)
Total Protein: 6.6 g/dL (ref 6.5–8.1)

## 2023-04-27 LAB — CBC
HCT: 36.5 % (ref 36.0–46.0)
Hemoglobin: 12.2 g/dL (ref 12.0–15.0)
MCH: 28.9 pg (ref 26.0–34.0)
MCHC: 33.4 g/dL (ref 30.0–36.0)
MCV: 86.5 fL (ref 80.0–100.0)
Platelets: 195 10*3/uL (ref 150–400)
RBC: 4.22 MIL/uL (ref 3.87–5.11)
RDW: 13.1 % (ref 11.5–15.5)
WBC: 8.9 10*3/uL (ref 4.0–10.5)
nRBC: 0 % (ref 0.0–0.2)

## 2023-04-27 LAB — BASIC METABOLIC PANEL
Anion gap: 9 (ref 5–15)
BUN: 9 mg/dL (ref 6–20)
CO2: 22 mmol/L (ref 22–32)
Calcium: 9 mg/dL (ref 8.9–10.3)
Chloride: 106 mmol/L (ref 98–111)
Creatinine, Ser: 0.78 mg/dL (ref 0.44–1.00)
GFR, Estimated: >60 mL/min (ref >60)
Glucose, Bld: 122 mg/dL — ABNORMAL HIGH (ref 70–99)
Potassium: 3.8 mmol/L (ref 3.5–5.1)
Sodium: 137 mmol/L (ref 135–145)

## 2023-04-27 LAB — LIPASE, BLOOD: Lipase: 34 U/L (ref 11–51)

## 2023-04-27 LAB — HCG, QUANTITATIVE, PREGNANCY: hCG, Beta Chain, Quant, S: 41424 m[IU]/mL — ABNORMAL HIGH (ref <5)

## 2023-04-27 NOTE — ED Notes (Signed)
This RN assessed FHR of baby @ 160.

## 2023-04-27 NOTE — ED Triage Notes (Signed)
Pt sts that she has been having abd pain. Pt sts that feels like abd gets really hard and than it does relax awhile later.  Pt is [redacted] weeks pregnant. Pt is [id number]

## 2023-04-27 NOTE — ED Provider Notes (Signed)
[facility] Provider Note    Event Date/Time   First MD Initiated Contact with Patient 04/27/23 2118     (approximate)   History   Chief Complaint Abdominal Pain   HPI  Gabrielle Romero is a 32 y.o. female [id number] at approximately 17 weeks of pregnancy who presents to the ED complaining of abdominal pain.  History is limited as patient is Spanish-speaking only, history obtained via interpreter [phone].  Patient reports that she has been having intermittent pain in her suprapubic area for the past 3 days.  She reports cramping-like pain where it feels like things get firm into her lower abdomen.  Pain happens a couple of times a day and can last up to an hour at a time.  It does not seem to be exacerbated or alleviated by anything in particular.  She denies any associated vaginal bleeding or discharge, has not had any dysuria or hematuria.  She also denies any fevers or flank pain.  She has not taken anything for the pain and has not contacted her OB/GYN.     Physical Exam   Triage Vital Signs: ED Triage Vitals [04/27/23 1847]  Encounter Vitals Group     BP 123/67     Systolic BP Percentile      Diastolic BP Percentile      Pulse Rate 76     Resp 17     Temp 98.6 F (37 C)     Temp Source Oral     SpO2 100 %     Weight 183 lb (83 kg)     Height 5\' 5"  (1.651 m)     Head Circumference      Peak Flow      Pain Score 4     Pain Loc      Pain Education      Exclude from Growth Chart     Most recent vital signs: Vitals:   04/27/23 1847 04/27/23 2309  BP: 123/67 118/77  Pulse: 76 79  Resp: 17 19  Temp: 98.6 F (37 C) 98.5 F (36.9 C)  SpO2: 100% 99%    Constitutional: Alert and oriented. Eyes: Conjunctivae are normal. Head: Atraumatic. Nose: No congestion/rhinnorhea. Mouth/Throat: Mucous membranes are moist.  Cardiovascular: Normal rate, regular rhythm. Grossly normal heart sounds.  2+ radial pulses bilaterally. Respiratory: Normal  respiratory effort.  No retractions. Lungs CTAB. Gastrointestinal: Gravid abdomen, soft and nontender. No distention. Musculoskeletal: No lower extremity tenderness nor edema.  Neurologic:  Normal speech and language. No gross focal neurologic deficits are appreciated.    ED Results / Procedures / Treatments   Labs (all labs ordered are listed, but only abnormal results are displayed) Labs Reviewed  BASIC METABOLIC PANEL - Abnormal; Notable for the following components:      Result Value   Glucose, Bld 122 (*)    All other components within normal limits  HCG, QUANTITATIVE, PREGNANCY - Abnormal; Notable for the following components:   hCG, Beta Chain, Quant, S 41,424 (*)    All other components within normal limits  HEPATIC FUNCTION PANEL - Abnormal; Notable for the following components:   Albumin 3.3 (*)    AST 14 (*)    All other components within normal limits  URINALYSIS, ROUTINE W REFLEX MICROSCOPIC - Abnormal; Notable for the following components:   Color, Urine YELLOW (*)    APPearance CLOUDY (*)    Leukocytes,Ua SMALL (*)    Bacteria, UA FEW (*)  All other components within normal limits  CBC  LIPASE, BLOOD    PROCEDURES:  Critical Care performed: No  Ultrasound ED OB Pelvic  Date/Time: 04/27/2023 11:24 PM  Performed by: Chesley Noon, MD Authorized by: Chesley Noon, MD   Procedure details:    Indications: evaluate for IUP and pregnant with abdominal pain     Assess:  Intrauterine pregnancy   Technique:  Transabdominal obstetric (HCG+) exam   Images: not archived    Uterine findings:    Intrauterine pregnancy: identified     Single gestation: identified     Fetal heart rate: identified (150)         MEDICATIONS ORDERED IN ED: Medications - No data to display   IMPRESSION / MDM / ASSESSMENT AND PLAN / ED COURSE  I reviewed the triage vital signs and the nursing notes.                              32 y.o. female [phone] at approximately  17 weeks of pregnancy who presents to the ED with intermittent abdominal pain in her suprapubic area for the past 3 days.  Patient's presentation is most consistent with acute presentation with potential threat to life or bodily function.  Differential diagnosis includes, but is not limited to, ectopic pregnancy, round ligament pain, UTI, appendicitis, kidney stone, diverticulitis.  Patient well-appearing and in no acute distress, vital signs are unremarkable.  Patient's abdominal exam is benign with no focal tenderness and she denies any pain currently.  Bedside obstetric ultrasound shows intrauterine pregnancy with appropriate fetal movement and heart rate.  Labs are reassuring with no significant anemia, leukocytosis, electrolyte abnormality, or AKI.  LFTs and lipase are unremarkable, urinalysis shows no signs of infection.  Given reassuring workup, patient appropriate for discharge home and follow-up with her OB/GYN.  She was counseled to return to the ED for new or worsening symptoms, patient agrees to plan.      FINAL CLINICAL IMPRESSION(S) / ED DIAGNOSES   Final diagnoses:  Abdominal pain during pregnancy in second trimester     Rx / DC Orders   ED Discharge Orders     None        Note:  This document was prepared using Dragon voice recognition software and may include unintentional dictation errors.   Chesley Noon, MD 04/27/23 2326

## 2023-04-27 NOTE — ED Notes (Signed)
Discharge instructions reviewed with patient. Patient questions answered and opportunity for education reviewed. Patient voices understanding of discharge instructions with no further questions. Patient ambulatory with steady gait to lobby.
